# Patient Record
Sex: Female | Born: 1960
Health system: Southern US, Community
[De-identification: ages and names within clinical notes are randomized; demographics above are authoritative.]

## PROBLEM LIST (undated history)

## (undated) DIAGNOSIS — Z9889 Other specified postprocedural states: Secondary | ICD-10-CM

## (undated) DIAGNOSIS — L409 Psoriasis, unspecified: Secondary | ICD-10-CM

## (undated) DIAGNOSIS — Z9221 Personal history of antineoplastic chemotherapy: Secondary | ICD-10-CM

## (undated) DIAGNOSIS — C50919 Malignant neoplasm of unspecified site of unspecified female breast: Secondary | ICD-10-CM

## (undated) DIAGNOSIS — C801 Malignant (primary) neoplasm, unspecified: Secondary | ICD-10-CM

## (undated) DIAGNOSIS — R112 Nausea with vomiting, unspecified: Secondary | ICD-10-CM

## (undated) DIAGNOSIS — E079 Disorder of thyroid, unspecified: Secondary | ICD-10-CM

## (undated) HISTORY — PX: BREAST RECONSTRUCTION: SHX9

## (undated) HISTORY — PX: CRANIOTOMY FOR AVM: SUR332

## (undated) HISTORY — DX: Malignant (primary) neoplasm, unspecified: C80.1

## (undated) HISTORY — PX: ABDOMINAL HYSTERECTOMY: SHX81

## (undated) HISTORY — PX: TOTAL LAPAROSCOPIC HYSTERECTOMY WITH BILATERAL SALPINGO OOPHORECTOMY: SHX6845

## (undated) HISTORY — PX: KNEE ARTHROSCOPY: SUR90

## (undated) HISTORY — DX: Disorder of thyroid, unspecified: E07.9

## (undated) HISTORY — PX: BREAST SURGERY: SHX581

## (undated) HISTORY — PX: APPENDECTOMY: SHX54

## (undated) HISTORY — DX: Psoriasis, unspecified: L40.9

---
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
## 1996-02-20 HISTORY — PX: CRANIOTOMY FOR AVM: SUR332

## 2003-02-20 HISTORY — PX: KNEE ARTHROSCOPY: SUR90

## 2003-12-31 ENCOUNTER — Ambulatory Visit: Payer: Self-pay | Admitting: Unknown Physician Specialty

## 2004-06-15 ENCOUNTER — Ambulatory Visit: Payer: Self-pay | Admitting: Unknown Physician Specialty

## 2005-02-01 ENCOUNTER — Ambulatory Visit: Payer: Self-pay | Admitting: Unknown Physician Specialty

## 2006-02-07 ENCOUNTER — Ambulatory Visit: Payer: Self-pay | Admitting: Unknown Physician Specialty

## 2006-02-19 HISTORY — PX: DILATION AND CURETTAGE OF UTERUS: SHX78

## 2006-08-01 ENCOUNTER — Ambulatory Visit: Payer: Self-pay | Admitting: Unknown Physician Specialty

## 2007-03-06 ENCOUNTER — Ambulatory Visit: Payer: Self-pay | Admitting: Unknown Physician Specialty

## 2008-02-20 DIAGNOSIS — Z9221 Personal history of antineoplastic chemotherapy: Secondary | ICD-10-CM

## 2008-02-20 HISTORY — DX: Personal history of antineoplastic chemotherapy: Z92.21

## 2008-02-20 HISTORY — PX: BREAST BIOPSY: SHX20

## 2008-02-20 HISTORY — PX: MASTECTOMY: SHX3

## 2008-03-08 ENCOUNTER — Ambulatory Visit: Payer: Self-pay | Admitting: Unknown Physician Specialty

## 2008-03-22 ENCOUNTER — Ambulatory Visit: Payer: Self-pay | Admitting: Internal Medicine

## 2009-01-19 DIAGNOSIS — C50919 Malignant neoplasm of unspecified site of unspecified female breast: Secondary | ICD-10-CM

## 2009-01-19 HISTORY — DX: Malignant neoplasm of unspecified site of unspecified female breast: C50.919

## 2009-01-25 HISTORY — PX: BREAST SURGERY: SHX581

## 2009-01-28 ENCOUNTER — Ambulatory Visit: Payer: Self-pay | Admitting: General Surgery

## 2009-02-19 DIAGNOSIS — C801 Malignant (primary) neoplasm, unspecified: Secondary | ICD-10-CM

## 2009-02-19 HISTORY — PX: BREAST SURGERY: SHX581

## 2009-02-19 HISTORY — PX: ABDOMINAL HYSTERECTOMY: SHX81

## 2009-02-19 HISTORY — PX: PORTACATH PLACEMENT: SHX2246

## 2009-02-19 HISTORY — DX: Malignant (primary) neoplasm, unspecified: C80.1

## 2009-02-19 HISTORY — PX: BREAST RECONSTRUCTION: SHX9

## 2009-03-01 ENCOUNTER — Ambulatory Visit: Payer: Self-pay | Admitting: General Surgery

## 2009-03-10 ENCOUNTER — Ambulatory Visit: Payer: Self-pay | Admitting: General Surgery

## 2009-03-15 ENCOUNTER — Ambulatory Visit: Payer: Self-pay

## 2009-03-22 ENCOUNTER — Ambulatory Visit: Payer: Self-pay | Admitting: Oncology

## 2009-04-13 ENCOUNTER — Ambulatory Visit: Payer: Self-pay | Admitting: Oncology

## 2009-04-19 ENCOUNTER — Ambulatory Visit: Payer: Self-pay | Admitting: Oncology

## 2009-04-27 ENCOUNTER — Ambulatory Visit: Payer: Self-pay | Admitting: Oncology

## 2009-05-20 ENCOUNTER — Ambulatory Visit: Payer: Self-pay | Admitting: Oncology

## 2009-05-26 ENCOUNTER — Ambulatory Visit: Payer: Self-pay | Admitting: General Surgery

## 2009-06-19 ENCOUNTER — Ambulatory Visit: Payer: Self-pay | Admitting: Oncology

## 2009-07-20 ENCOUNTER — Ambulatory Visit: Payer: Self-pay | Admitting: Oncology

## 2009-08-19 ENCOUNTER — Ambulatory Visit: Payer: Self-pay | Admitting: Oncology

## 2009-08-19 HISTORY — PX: AUGMENTATION MAMMAPLASTY: SUR837

## 2009-09-09 ENCOUNTER — Ambulatory Visit: Payer: Self-pay | Admitting: General Surgery

## 2009-09-15 ENCOUNTER — Ambulatory Visit: Payer: Self-pay

## 2009-10-05 ENCOUNTER — Ambulatory Visit: Payer: Self-pay | Admitting: Oncology

## 2009-10-06 LAB — CANCER ANTIGEN 27.29: CA 27.29: 21.2 U/mL (ref 0.0–38.6)

## 2009-10-20 ENCOUNTER — Ambulatory Visit: Payer: Self-pay | Admitting: Oncology

## 2009-11-19 ENCOUNTER — Ambulatory Visit: Payer: Self-pay | Admitting: Oncology

## 2009-12-08 ENCOUNTER — Encounter: Admission: RE | Admit: 2009-12-08 | Discharge: 2009-12-08 | Payer: Self-pay

## 2009-12-29 ENCOUNTER — Ambulatory Visit: Payer: Self-pay | Admitting: Oncology

## 2010-01-03 ENCOUNTER — Ambulatory Visit: Payer: Self-pay

## 2010-01-19 ENCOUNTER — Ambulatory Visit: Payer: Self-pay | Admitting: Oncology

## 2010-01-31 ENCOUNTER — Ambulatory Visit: Payer: Self-pay

## 2010-02-02 ENCOUNTER — Ambulatory Visit: Payer: Self-pay

## 2010-03-21 ENCOUNTER — Ambulatory Visit: Payer: Self-pay | Admitting: Oncology

## 2010-03-22 ENCOUNTER — Ambulatory Visit: Payer: Self-pay | Admitting: Oncology

## 2010-03-22 LAB — CANCER ANTIGEN 27.29: CA 27.29: 25.8 U/mL (ref 0.0–38.6)

## 2010-04-25 ENCOUNTER — Ambulatory Visit: Payer: Self-pay | Admitting: Oncology

## 2010-05-09 ENCOUNTER — Ambulatory Visit: Payer: Self-pay

## 2010-05-21 ENCOUNTER — Ambulatory Visit: Payer: Self-pay | Admitting: Oncology

## 2010-08-21 ENCOUNTER — Ambulatory Visit: Payer: Self-pay | Admitting: Oncology

## 2010-09-20 ENCOUNTER — Ambulatory Visit: Payer: Self-pay | Admitting: Oncology

## 2011-02-22 ENCOUNTER — Ambulatory Visit: Payer: Self-pay | Admitting: Oncology

## 2011-02-22 LAB — COMPREHENSIVE METABOLIC PANEL
Albumin: 4 g/dL (ref 3.4–5.0)
Anion Gap: 8 (ref 7–16)
BUN: 21 mg/dL — ABNORMAL HIGH (ref 7–18)
Bilirubin,Total: 0.3 mg/dL (ref 0.2–1.0)
Chloride: 104 mmol/L (ref 98–107)
Creatinine: 1.25 mg/dL (ref 0.60–1.30)
Glucose: 93 mg/dL (ref 65–99)
Osmolality: 288 (ref 275–301)
Potassium: 4.4 mmol/L (ref 3.5–5.1)
Sodium: 143 mmol/L (ref 136–145)
Total Protein: 7.9 g/dL (ref 6.4–8.2)

## 2011-02-22 LAB — CBC CANCER CENTER
Basophil %: 0.4 %
Eosinophil %: 1.5 %
HGB: 15.7 g/dL (ref 12.0–16.0)
Lymphocyte %: 21.6 %
Neutrophil %: 70.6 %
RBC: 4.97 10*6/uL (ref 3.80–5.20)
WBC: 6 x10 3/mm (ref 3.6–11.0)

## 2011-02-23 LAB — CANCER ANTIGEN 27.29: CA 27.29: 34.2 U/mL (ref 0.0–38.6)

## 2011-03-23 ENCOUNTER — Ambulatory Visit: Payer: Self-pay | Admitting: Oncology

## 2011-08-27 ENCOUNTER — Ambulatory Visit: Payer: Self-pay | Admitting: Oncology

## 2011-08-27 LAB — CBC CANCER CENTER
Basophil #: 0 x10 3/mm (ref 0.0–0.1)
Eosinophil #: 0.1 x10 3/mm (ref 0.0–0.7)
Lymphocyte #: 1.3 x10 3/mm (ref 1.0–3.6)
MCH: 31.7 pg (ref 26.0–34.0)
MCHC: 33.3 g/dL (ref 32.0–36.0)
MCV: 95 fL (ref 80–100)
Monocyte #: 0.6 x10 3/mm (ref 0.2–0.9)
Monocyte %: 7.7 %
Platelet: 129 x10 3/mm — ABNORMAL LOW (ref 150–440)
RDW: 13.2 % (ref 11.5–14.5)
WBC: 8.3 x10 3/mm (ref 3.6–11.0)

## 2011-08-27 LAB — COMPREHENSIVE METABOLIC PANEL
Bilirubin,Total: 0.4 mg/dL (ref 0.2–1.0)
Calcium, Total: 9.5 mg/dL (ref 8.5–10.1)
Chloride: 104 mmol/L (ref 98–107)
Co2: 26 mmol/L (ref 21–32)
Creatinine: 1.11 mg/dL (ref 0.60–1.30)
EGFR (African American): >60
EGFR (Non-African Amer.): 58 — ABNORMAL LOW
Osmolality: 280 (ref 275–301)
SGPT (ALT): 25 U/L
Sodium: 140 mmol/L (ref 136–145)

## 2011-08-28 LAB — CANCER ANTIGEN 27.29: CA 27.29: 27.8 U/mL (ref 0.0–38.6)

## 2011-09-20 ENCOUNTER — Ambulatory Visit: Payer: Self-pay | Admitting: Oncology

## 2012-03-22 ENCOUNTER — Ambulatory Visit: Payer: Self-pay | Admitting: Oncology

## 2012-03-24 LAB — COMPREHENSIVE METABOLIC PANEL
Anion Gap: 7 (ref 7–16)
BUN: 19 mg/dL — ABNORMAL HIGH (ref 7–18)
Co2: 32 mmol/L (ref 21–32)
Creatinine: 0.99 mg/dL (ref 0.60–1.30)
SGPT (ALT): 25 U/L (ref 12–78)
Sodium: 142 mmol/L (ref 136–145)
Total Protein: 7.3 g/dL (ref 6.4–8.2)

## 2012-03-24 LAB — CBC CANCER CENTER
Eosinophil #: 0.1 x10 3/mm (ref 0.0–0.7)
HGB: 14.6 g/dL (ref 12.0–16.0)
Lymphocyte %: 28.8 %
MCHC: 34 g/dL (ref 32.0–36.0)
Monocyte #: 0.4 x10 3/mm (ref 0.2–0.9)
RBC: 4.66 10*6/uL (ref 3.80–5.20)
WBC: 5.1 x10 3/mm (ref 3.6–11.0)

## 2012-03-25 LAB — CANCER ANTIGEN 27.29: CA 27.29: 21.3 U/mL (ref 0.0–38.6)

## 2012-04-19 ENCOUNTER — Ambulatory Visit: Payer: Self-pay | Admitting: Oncology

## 2012-09-24 ENCOUNTER — Ambulatory Visit: Payer: Self-pay | Admitting: Oncology

## 2012-09-25 LAB — COMPREHENSIVE METABOLIC PANEL
BUN: 27 mg/dL — ABNORMAL HIGH (ref 7–18)
Creatinine: 1.21 mg/dL (ref 0.60–1.30)
EGFR (Non-African Amer.): 52 — ABNORMAL LOW
Glucose: 130 mg/dL — ABNORMAL HIGH (ref 65–99)
Potassium: 3.7 mmol/L (ref 3.5–5.1)
SGOT(AST): 25 U/L (ref 15–37)
SGPT (ALT): 25 U/L (ref 12–78)
Sodium: 141 mmol/L (ref 136–145)

## 2012-09-25 LAB — CBC CANCER CENTER
Basophil #: 0 x10 3/mm (ref 0.0–0.1)
Eosinophil #: 0 x10 3/mm (ref 0.0–0.7)
HCT: 42.7 % (ref 35.0–47.0)
HGB: 14.7 g/dL (ref 12.0–16.0)
Lymphocyte #: 1.2 x10 3/mm (ref 1.0–3.6)
MCHC: 34.5 g/dL (ref 32.0–36.0)
MCV: 92 fL (ref 80–100)
Monocyte #: 0.4 x10 3/mm (ref 0.2–0.9)
Monocyte %: 4.8 %
Neutrophil %: 78.4 %
RDW: 13.4 % (ref 11.5–14.5)

## 2012-09-28 LAB — CANCER ANTIGEN 27.29: CA 27.29: 17.6 U/mL (ref 0.0–38.6)

## 2012-10-07 ENCOUNTER — Encounter: Payer: Self-pay | Admitting: *Deleted

## 2012-10-20 ENCOUNTER — Ambulatory Visit: Payer: Self-pay | Admitting: Oncology

## 2012-10-22 ENCOUNTER — Ambulatory Visit: Payer: Self-pay | Admitting: General Surgery

## 2012-10-29 ENCOUNTER — Ambulatory Visit (INDEPENDENT_AMBULATORY_CARE_PROVIDER_SITE_OTHER): Payer: 59 | Admitting: General Surgery

## 2012-10-29 ENCOUNTER — Encounter: Payer: Self-pay | Admitting: General Surgery

## 2012-10-29 VITALS — BP 118/80 | HR 64 | Resp 12 | Ht 64.0 in | Wt 133.0 lb

## 2012-10-29 DIAGNOSIS — Z1211 Encounter for screening for malignant neoplasm of colon: Secondary | ICD-10-CM

## 2012-10-29 MED ORDER — POLYETHYLENE GLYCOL 3350 17 GM/SCOOP PO POWD: ORAL | Status: DC

## 2012-10-29 NOTE — Progress Notes (Signed)
Patient ID: Susan Green, female   DOB: [birth year], 52 y.o.   MRN: [id number]  Chief Complaint  Patient presents with  . Other    colonoscopy    HPI Susan Green is a 52 y.o. female.  Patient here today to discuss having a colonoscopy and bone density test referred by Dr Haskel Khan. Denies GI symptoms.  States BM are regular and on occasion loose stool but no bleeding. HPI  Past Medical History  Diagnosis Date  . Psoriasis   . Thyroid disease     hypothyroidism  . 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 Tonsillectomy and adenoidectomy  1966  . Knee arthroscopy Right 2005  . Dilation and curettage of uterus  2008    polyp  . Abdominal hysterectomy  2011  . Craniotomy for avm  1998  . Portacath placement  2011  . Breast surgery Right 01/25/2009    mastectomy  . Breast reconstruction  2011, 2012    3  . Breast biopsy Right October 02, 2012    Fat necrosis    Family History  Problem Relation Age of Onset  . Colon cancer Paternal Grandfather 61    Social History History  Substance Use Topics  . Smoking status: Never Smoker   . Smokeless tobacco: Not on file  . Alcohol Use: No    Allergies  Allergen Reactions  . Codeine Nausea And Vomiting  . Demerol [Meperidine] Nausea Only    Current Outpatient Prescriptions  Medication Sig Dispense Refill  . levothyroxine (SYNTHROID, LEVOTHROID) 100 MCG tablet Take 1 tablet by mouth daily.      . Multiple Vitamin (MULTIVITAMIN) capsule Take 1 capsule by mouth daily.      . tamoxifen (NOLVADEX) 20 MG tablet Take 1 tablet by mouth daily.      . valACYclovir (VALTREX) 500 MG tablet Take 1 tablet by mouth as needed.       . polyethylene glycol powder (GLYCOLAX/MIRALAX) powder 255 grams one bottle for colonoscopy prep  255 g  0   No current facility-administered medications for this visit.     Review of Systems Review of Systems  Constitutional: Negative.   Respiratory: Negative.   Cardiovascular: Negative.   Gastrointestinal: Negative for vomiting, constipation, blood in stool and rectal pain.    Blood pressure 118/80, pulse 64, resp. rate 12, height 5\' 4"  (1.626 m), weight 133 lb (60.328 kg).  Physical Exam Physical Exam  Constitutional: She appears well-developed and well-nourished.  Neck: Normal range of motion. Neck supple.  Cardiovascular: Normal rate and regular rhythm.   Pulmonary/Chest: Effort normal and breath sounds normal.    Data Reviewed None  Assessment    Candidate for screening colonoscopy.    Plan    The pros and cons of the procedure were reviewed. This will be scheduled at a convenient day.     This patient will contact Dr. Wyn Forster office to arrange bone density screening.  Patient has been scheduled for a colonoscopy on 01-06-13 at [facility].    Earline Mayotte 10/31/2012, 5:16 PM

## 2012-10-29 NOTE — Patient Instructions (Addendum)
Colonoscopy A colonoscopy is an exam to evaluate your entire colon. In this exam, your colon is cleansed. A long fiberoptic tube is inserted through your rectum and into your colon. The fiberoptic scope (endoscope) is a long bundle of enclosed and very flexible fibers. These fibers transmit light to the area examined and send images from that area to your caregiver. Discomfort is usually minimal. You may be given a drug to help you sleep (sedative) during or prior to the procedure. This exam helps to detect lumps (tumors), polyps, inflammation, and areas of bleeding. Your caregiver may also take a small piece of tissue (biopsy) that will be examined under a microscope. LET YOUR CAREGIVER KNOW ABOUT:   Allergies to food or medicine.  Medicines taken, including vitamins, herbs, eyedrops, over-the-counter medicines, and creams.  Use of steroids (by mouth or creams).  Previous problems with anesthetics or numbing medicines.  History of bleeding problems or blood clots.  Previous surgery.  Other health problems, including diabetes and kidney problems.  Possibility of pregnancy, if this applies. BEFORE THE PROCEDURE   A clear liquid diet may be required for 2 days before the exam.  Ask your caregiver about changing or stopping your regular medications.  Liquid injections (enemas) or laxatives may be required.  A large amount of electrolyte solution may be given to you to drink over a short period of time. This solution is used to clean out your colon.  You should be present 60 minutes prior to your procedure or as directed by your caregiver. AFTER THE PROCEDURE   If you received a sedative or pain relieving medication, you will need to arrange for someone to drive you home.  Occasionally, there is a little blood passed with the first bowel movement. Do not be concerned. FINDING OUT THE RESULTS OF YOUR TEST Not all test results are available during your visit. If your test results are  not back during the visit, make an appointment with your caregiver to find out the results. Do not assume everything is normal if you have not heard from your caregiver or the medical facility. It is important for you to follow up on all of your test results. HOME CARE INSTRUCTIONS   It is not unusual to pass moderate amounts of gas and experience mild abdominal cramping following the procedure. This is due to air being used to inflate your colon during the exam. Walking or a warm pack on your belly (abdomen) may help.  You may resume all normal meals and activities after sedatives and medicines have worn off.  Only take over-the-counter or prescription medicines for pain, discomfort, or fever as directed by your caregiver. Do not use aspirin or blood thinners if a biopsy was taken. Consult your caregiver for medicine usage if biopsies were taken. SEEK IMMEDIATE MEDICAL CARE IF:   You have a fever.  You pass large blood clots or fill a toilet with blood following the procedure. This may also occur 10 to 14 days following the procedure. This is more likely if a biopsy was taken.  You develop abdominal pain that keeps getting worse and cannot be relieved with medicine. Document Released: 02/03/2000 Document Revised: 04/30/2011 Document Reviewed: 09/18/2007 [facility] Patient Information 2014 [place], [place].  This patient will contact Dr. Wyn Forster office to arrange bone density screening.  Patient has been scheduled for a colonoscopy on 01-06-13 at [facility].

## 2012-10-31 ENCOUNTER — Encounter: Payer: Self-pay | Admitting: General Surgery

## 2012-10-31 DIAGNOSIS — Z1211 Encounter for screening for malignant neoplasm of colon: Secondary | ICD-10-CM | POA: Insufficient documentation

## 2012-11-04 ENCOUNTER — Ambulatory Visit: Payer: Self-pay | Admitting: General Surgery

## 2012-11-11 ENCOUNTER — Ambulatory Visit: Payer: Self-pay | Admitting: Otolaryngology

## 2012-12-27 ENCOUNTER — Other Ambulatory Visit: Payer: Self-pay | Admitting: General Surgery

## 2012-12-27 DIAGNOSIS — Z1211 Encounter for screening for malignant neoplasm of colon: Secondary | ICD-10-CM

## 2012-12-29 ENCOUNTER — Telehealth: Payer: Self-pay | Admitting: *Deleted

## 2012-12-29 NOTE — Telephone Encounter (Signed)
Patient was contacted to verify that her medications are still the same. This patient reports she is still taking what she was on at last office visit except for valtrex and has added Fosamax (generic) and calcium with vitamin D 3. Medication list has been updated accordingly. Patient was instructed to pre-register by Friday since she has not done so already. We will proceed with colonoscopy that is scheduled for 01-06-13 at [facility].

## 2013-01-06 ENCOUNTER — Ambulatory Visit: Payer: Self-pay | Admitting: General Surgery

## 2013-01-06 DIAGNOSIS — Z1211 Encounter for screening for malignant neoplasm of colon: Secondary | ICD-10-CM

## 2013-01-07 ENCOUNTER — Encounter: Payer: Self-pay | Admitting: General Surgery

## 2013-01-26 ENCOUNTER — Encounter: Payer: Self-pay | Admitting: General Surgery

## 2013-01-27 ENCOUNTER — Encounter: Payer: Self-pay | Admitting: General Surgery

## 2013-03-27 ENCOUNTER — Ambulatory Visit: Payer: Self-pay | Admitting: Oncology

## 2013-04-25 ENCOUNTER — Observation Stay: Payer: Self-pay | Admitting: General Surgery

## 2013-04-25 DIAGNOSIS — K562 Volvulus: Secondary | ICD-10-CM

## 2013-04-25 LAB — URINALYSIS, COMPLETE
BLOOD: NEGATIVE
Bacteria: NONE SEEN
Bilirubin,UR: NEGATIVE
Glucose,UR: NEGATIVE mg/dL (ref 0–75)
Hyaline Cast: 2
Leukocyte Esterase: NEGATIVE
NITRITE: NEGATIVE
PH: 5 (ref 4.5–8.0)
Protein: NEGATIVE
RBC,UR: NONE SEEN /HPF (ref 0–5)
SPECIFIC GRAVITY: 1.026 (ref 1.003–1.030)
WBC UR: 3 /HPF (ref 0–5)

## 2013-04-25 LAB — CBC WITH DIFFERENTIAL/PLATELET
BASOS PCT: 0.8 %
Basophil #: 0.1 10*3/uL (ref 0.0–0.1)
EOS PCT: 0.2 %
Eosinophil #: 0 10*3/uL (ref 0.0–0.7)
HCT: 42.3 % (ref 35.0–47.0)
HGB: 13.5 g/dL (ref 12.0–16.0)
LYMPHS ABS: 1.3 10*3/uL (ref 1.0–3.6)
LYMPHS PCT: 12.2 %
MCH: 29.2 pg (ref 26.0–34.0)
MCHC: 32 g/dL (ref 32.0–36.0)
MCV: 92 fL (ref 80–100)
MONOS PCT: 5.3 %
Monocyte #: 0.6 x10 3/mm (ref 0.2–0.9)
NEUTROS ABS: 8.6 10*3/uL — AB (ref 1.4–6.5)
Neutrophil %: 81.5 %
PLATELETS: 197 10*3/uL (ref 150–440)
RBC: 4.62 10*6/uL (ref 3.80–5.20)
RDW: 12.8 % (ref 11.5–14.5)
WBC: 10.6 10*3/uL (ref 3.6–11.0)

## 2013-04-25 LAB — COMPREHENSIVE METABOLIC PANEL
Albumin: 3.9 g/dL (ref 3.4–5.0)
Alkaline Phosphatase: 47 U/L
Anion Gap: 17 — ABNORMAL HIGH (ref 7–16)
BUN: 25 mg/dL — ABNORMAL HIGH (ref 7–18)
Bilirubin,Total: 0.4 mg/dL (ref 0.2–1.0)
Calcium, Total: 8.7 mg/dL (ref 8.5–10.1)
Chloride: 101 mmol/L (ref 98–107)
Co2: 29 mmol/L (ref 21–32)
Creatinine: 1.04 mg/dL (ref 0.60–1.30)
EGFR (African American): >60
EGFR (Non-African Amer.): >60
Glucose: 127 mg/dL — ABNORMAL HIGH (ref 65–99)
Osmolality: 298 (ref 275–301)
Potassium: 3.7 mmol/L (ref 3.5–5.1)
SGOT(AST): 36 U/L (ref 15–37)
SGPT (ALT): 26 U/L (ref 12–78)
Sodium: 147 mmol/L — ABNORMAL HIGH (ref 136–145)
Total Protein: 7.3 g/dL (ref 6.4–8.2)

## 2013-04-26 LAB — CBC WITH DIFFERENTIAL/PLATELET
BASOS PCT: 1.2 %
Basophil #: 0.1 10*3/uL (ref 0.0–0.1)
Eosinophil #: 0.1 10*3/uL (ref 0.0–0.7)
Eosinophil %: 1.9 %
HCT: 39.5 % (ref 35.0–47.0)
HGB: 13.1 g/dL (ref 12.0–16.0)
LYMPHS ABS: 1.7 10*3/uL (ref 1.0–3.6)
Lymphocyte %: 30.8 %
MCH: 30.4 pg (ref 26.0–34.0)
MCHC: 33.2 g/dL (ref 32.0–36.0)
MCV: 92 fL (ref 80–100)
MONO ABS: 0.5 x10 3/mm (ref 0.2–0.9)
Monocyte %: 9.4 %
NEUTROS ABS: 3.1 10*3/uL (ref 1.4–6.5)
Neutrophil %: 56.7 %
PLATELETS: 173 10*3/uL (ref 150–440)
RBC: 4.31 10*6/uL (ref 3.80–5.20)
RDW: 13.2 % (ref 11.5–14.5)
WBC: 5.5 10*3/uL (ref 3.6–11.0)

## 2013-04-27 ENCOUNTER — Other Ambulatory Visit: Payer: Self-pay | Admitting: General Surgery

## 2013-04-27 ENCOUNTER — Encounter: Payer: Self-pay | Admitting: General Surgery

## 2013-04-27 DIAGNOSIS — K562 Volvulus: Secondary | ICD-10-CM

## 2013-04-27 DIAGNOSIS — R109 Unspecified abdominal pain: Secondary | ICD-10-CM

## 2013-04-28 ENCOUNTER — Telehealth: Payer: Self-pay | Admitting: *Deleted

## 2013-04-28 NOTE — Telephone Encounter (Signed)
fmla papers

## 2013-04-29 ENCOUNTER — Other Ambulatory Visit: Payer: Self-pay | Admitting: *Deleted

## 2013-04-29 ENCOUNTER — Ambulatory Visit: Payer: Self-pay | Admitting: General Surgery

## 2013-04-29 DIAGNOSIS — K562 Volvulus: Secondary | ICD-10-CM

## 2013-04-29 HISTORY — PX: APPENDECTOMY: SHX54

## 2013-04-29 HISTORY — PX: OTHER SURGICAL HISTORY: SHX169

## 2013-04-29 MED ORDER — ONDANSETRON 4 MG PO TBDP: 4.0000 mg | ORAL_TABLET | Freq: Four times a day (QID) | ORAL | Status: DC | PRN

## 2013-04-30 ENCOUNTER — Encounter: Payer: Self-pay | Admitting: General Surgery

## 2013-05-01 LAB — PATHOLOGY REPORT

## 2013-05-04 ENCOUNTER — Encounter: Payer: Self-pay | Admitting: General Surgery

## 2013-05-06 ENCOUNTER — Ambulatory Visit (INDEPENDENT_AMBULATORY_CARE_PROVIDER_SITE_OTHER): Payer: 59 | Admitting: General Surgery

## 2013-05-06 ENCOUNTER — Encounter: Payer: Self-pay | Admitting: General Surgery

## 2013-05-06 VITALS — BP 124/80 | HR 80 | Resp 12 | Ht 64.0 in | Wt 134.0 lb

## 2013-05-06 DIAGNOSIS — K562 Volvulus: Secondary | ICD-10-CM

## 2013-05-06 DIAGNOSIS — K37 Unspecified appendicitis: Secondary | ICD-10-CM

## 2013-05-06 NOTE — Progress Notes (Signed)
This is a 53 year old female here today for her post op Appendectomy and cecopexy which was done on 04/29/13. Patient states she is still have pain and burning at the incision site. Abdomen soft and non-tender. Incision site is clean and healing well.  Overall she is doing well. Primary problem appears to be constipation.  Advised to continue Miralax and use dulcolax tablets periodically.  Recheck in two weeks. Her presenting problem was cecal hypermobility. Appendix was removed in conjunction with cecopexy but path showed there was early appendicitis.

## 2013-05-06 NOTE — Patient Instructions (Signed)
Return in two weeks.  

## 2013-05-07 ENCOUNTER — Telehealth: Payer: Self-pay | Admitting: *Deleted

## 2013-05-07 NOTE — Telephone Encounter (Signed)
Patient states she had 4 bowel movement today.  Soft stools not runny.Patient will follow up as scheduled . She will call office for any  issues or concerns.

## 2013-05-20 ENCOUNTER — Encounter: Payer: Self-pay | Admitting: General Surgery

## 2013-05-20 ENCOUNTER — Ambulatory Visit (INDEPENDENT_AMBULATORY_CARE_PROVIDER_SITE_OTHER): Payer: 59 | Admitting: General Surgery

## 2013-05-20 VITALS — BP 120/72 | HR 74 | Resp 12 | Ht 64.0 in | Wt 134.0 lb

## 2013-05-20 DIAGNOSIS — K562 Volvulus: Secondary | ICD-10-CM

## 2013-05-20 NOTE — Progress Notes (Signed)
This is a 53 year old female here today for her post op appendectomy and cecopexy which was done on 04/29/13. Patient states she is improving from pain and burning at the incision site.   Abdomen soft and non-tender. Port sites are well healed.   Overall she is doing very well. Primary problem of constipation has improved.  She may return to work 05-24-13 as scheduled.

## 2013-05-20 NOTE — Patient Instructions (Signed)
The patient is aware to call back for any questions or concerns.  

## 2013-05-21 ENCOUNTER — Ambulatory Visit: Payer: Self-pay | Admitting: Oncology

## 2013-05-22 LAB — CBC CANCER CENTER
BASOS ABS: 0 x10 3/mm (ref 0.0–0.1)
BASOS PCT: 0.9 %
Eosinophil #: 0.1 x10 3/mm (ref 0.0–0.7)
Eosinophil %: 1.9 %
HCT: 43.7 % (ref 35.0–47.0)
HGB: 14.4 g/dL (ref 12.0–16.0)
LYMPHS PCT: 25.6 %
Lymphocyte #: 1.4 x10 3/mm (ref 1.0–3.6)
MCH: 30.5 pg (ref 26.0–34.0)
MCHC: 33 g/dL (ref 32.0–36.0)
MCV: 92 fL (ref 80–100)
Monocyte #: 0.4 x10 3/mm (ref 0.2–0.9)
Monocyte %: 7.3 %
Neutrophil #: 3.6 x10 3/mm (ref 1.4–6.5)
Neutrophil %: 64.3 %
Platelet: 177 x10 3/mm (ref 150–440)
RBC: 4.74 10*6/uL (ref 3.80–5.20)
RDW: 13.7 % (ref 11.5–14.5)
WBC: 5.6 x10 3/mm (ref 3.6–11.0)

## 2013-05-22 LAB — COMPREHENSIVE METABOLIC PANEL
ALBUMIN: 4.2 g/dL (ref 3.4–5.0)
ANION GAP: 3 — AB (ref 7–16)
AST: 46 U/L — AB (ref 15–37)
Alkaline Phosphatase: 49 U/L
BUN: 22 mg/dL — AB (ref 7–18)
Bilirubin,Total: 0.4 mg/dL (ref 0.2–1.0)
CALCIUM: 8.7 mg/dL (ref 8.5–10.1)
Chloride: 106 mmol/L (ref 98–107)
Co2: 32 mmol/L (ref 21–32)
Creatinine: 1.06 mg/dL (ref 0.60–1.30)
Glucose: 84 mg/dL (ref 65–99)
Osmolality: 284 (ref 275–301)
POTASSIUM: 3.7 mmol/L (ref 3.5–5.1)
SGPT (ALT): 31 U/L (ref 12–78)
Sodium: 141 mmol/L (ref 136–145)
Total Protein: 7.8 g/dL (ref 6.4–8.2)

## 2013-05-23 LAB — CANCER ANTIGEN 27.29: CA 27.29: 17.7 U/mL (ref 0.0–38.6)

## 2013-06-19 ENCOUNTER — Ambulatory Visit: Payer: Self-pay | Admitting: Oncology

## 2013-07-20 ENCOUNTER — Ambulatory Visit: Payer: Self-pay | Admitting: Oncology

## 2013-12-21 ENCOUNTER — Encounter: Payer: Self-pay | Admitting: General Surgery

## 2014-01-27 ENCOUNTER — Ambulatory Visit: Payer: Self-pay | Admitting: Oncology

## 2014-01-27 LAB — CBC CANCER CENTER
BASOS PCT: 0.7 %
Basophil #: 0 x10 3/mm (ref 0.0–0.1)
EOS ABS: 0.1 x10 3/mm (ref 0.0–0.7)
EOS PCT: 2.1 %
HCT: 42.2 % (ref 35.0–47.0)
HGB: 13.8 g/dL (ref 12.0–16.0)
LYMPHS ABS: 1.5 x10 3/mm (ref 1.0–3.6)
Lymphocyte %: 22.1 %
MCH: 30.5 pg (ref 26.0–34.0)
MCHC: 32.6 g/dL (ref 32.0–36.0)
MCV: 94 fL (ref 80–100)
MONO ABS: 0.6 x10 3/mm (ref 0.2–0.9)
MONOS PCT: 8.6 %
NEUTROS PCT: 66.5 %
Neutrophil #: 4.4 x10 3/mm (ref 1.4–6.5)
PLATELETS: 145 x10 3/mm — AB (ref 150–440)
RBC: 4.51 10*6/uL (ref 3.80–5.20)
RDW: 13.2 % (ref 11.5–14.5)
WBC: 6.6 x10 3/mm (ref 3.6–11.0)

## 2014-01-27 LAB — COMPREHENSIVE METABOLIC PANEL
ALK PHOS: 49 U/L
ANION GAP: 3 — AB (ref 7–16)
Albumin: 3.4 g/dL (ref 3.4–5.0)
BILIRUBIN TOTAL: 0.3 mg/dL (ref 0.2–1.0)
BUN: 18 mg/dL (ref 7–18)
CHLORIDE: 103 mmol/L (ref 98–107)
CO2: 34 mmol/L — AB (ref 21–32)
CREATININE: 1.02 mg/dL (ref 0.60–1.30)
Calcium, Total: 8.4 mg/dL — ABNORMAL LOW (ref 8.5–10.1)
EGFR (Non-African Amer.): >60
Glucose: 140 mg/dL — ABNORMAL HIGH (ref 65–99)
OSMOLALITY: 284 (ref 275–301)
POTASSIUM: 3.7 mmol/L (ref 3.5–5.1)
SGOT(AST): 22 U/L (ref 15–37)
SGPT (ALT): 22 U/L
Sodium: 140 mmol/L (ref 136–145)
Total Protein: 6.7 g/dL (ref 6.4–8.2)

## 2014-01-28 LAB — CANCER ANTIGEN 27.29: CA 27.29: 20 U/mL (ref 0.0–38.6)

## 2014-02-19 ENCOUNTER — Ambulatory Visit: Payer: Self-pay | Admitting: Oncology

## 2014-04-28 ENCOUNTER — Encounter
Admit: 2014-04-28 | Disposition: A | Discharge status: Home / Self Care | Payer: Self-pay | Attending: General Practice | Admitting: General Practice

## 2014-05-21 ENCOUNTER — Encounter
Admit: 2014-05-21 | Disposition: A | Discharge status: Home / Self Care | Payer: Self-pay | Attending: General Practice | Admitting: General Practice

## 2014-06-01 ENCOUNTER — Encounter: Payer: Self-pay | Admitting: *Deleted

## 2014-06-04 ENCOUNTER — Other Ambulatory Visit: Payer: Self-pay | Admitting: Oncology

## 2014-06-04 DIAGNOSIS — Z853 Personal history of malignant neoplasm of breast: Secondary | ICD-10-CM

## 2014-06-12 NOTE — Op Note (Signed)
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   OPERATION PERFORMED: Laparoscopy, cecopexy and appendectomy.   SURGEON: S.G. Jamal Collin, M.D.   ANESTHESIA: General.   COMPLICATIONS: None.   ESTIMATED BLOOD LOSS: Minimal.   DRAINS: None.   DESCRIPTION OF PROCEDURE: The patient was put to sleep in the supine position on the operating table. Foley catheter was inserted, which was removed at the end of the procedure. The abdomen was prepped and draped out as a sterile field. Timeout was performed. A small incision was made at the upper lip of the umbilicus. The Veress needle with the InnerDyne sleeve was positioned in the peritoneal cavity and verified with the hanging drop method and pneumoperitoneum was obtained. A 10 mm port was then placed. Following this, a 5 mm left lower quadrant port and a 12 mm port in the left lower abdomen laterally was placed. Evaluation of the abdominal cavity was then performed and there was noted to be no pathology identifiable over the pelvic structures. She had a remnant of her cervix. Ovaries were not identified. No hernias were noted. The cecum was lying low in the pelvis and was extremely redundant up to the mid lateral portion and seemed to be easily manipulated into the left upper quadrant area. The appendix was noted to be normal. An appendectomy was performed. The mesoappendix was freed from the base of the appendix, and using the Endo GIA blue load, the appendix was taken down followed by the white load on the mesoappendix and these were brought out with a retrieval bag through the larger port site on the left side.   Following this, the cecum was placed against the lateral wall going into the pelvis and using the Endo-360 device, 2-0 Polysorb sutures were placed,  a total of 4 approximating the cecum to the lateral pelvic wall and the lateral abdominal wall. Following this, the fascial opening in the 12 mm port site was closed with a figure-of-eight 0 Vicryl stitch placed with a suture passer. Pneumoperitoneum was released and the remaining ports were removed. The fascial opening in the umbilicus was closed also with 0 Vicryl and all skin incisions were closed with subcuticular 4-0 Vicryl covered with Dermabond. The procedure was well tolerated. She was subsequently extubated and returned to the recovery room in stable condition.   ____________________________ S.Robinette Haines, MD sgs:aw D: 04/30/2013 08:03:04 ET T: 04/30/2013 08:13:40 ET JOB#: [id number]  cc: Synthia Innocent. Jamal Collin, MD, <Dictator> [facility] Robinette Haines MD ELECTRONICALLY SIGNED 04/30/2013 15:11

## 2014-06-12 NOTE — H&P (Signed)
PATIENT NAME:  Susan Green, Susan Green MR#:  [id number] DATE OF BIRTH:  1960/10/26  DATE OF ADMISSION:  04/25/2013  HISTORY OF PRESENT ILLNESS: This is a 54 year old female who presented to the Emergency Room overnight after she started having significant lower abdominal pain while at work here overnight. The pain persisted and became fairly severe and subsequently had some Fleet's enemas, which seemed to help a little bit, but she continued to have pain and presented to the Emergency Room. The patient has not had any episode similar to this in the past and has been fairly active and denied any kind of abdominal symptoms in the past. The patient was monitored and evaluated in the Emergency Room, and a CT scan was performed showing a malpositioning of the cecum more to the medial and superior aspect, suggesting a developing volvulus. Surgical input was requested, but the patient now says the pain has fully resolved and she is feeling okay. She had some nausea after she had gotten some Dilaudid IV, which she has noticed has been a problem in the past with Dilaudid. She seems to tolerate morphine fairly well, but does not seem to have the same effectiveness with other narcotics. She did not have any nausea or vomiting prior to her getting the Dilaudid. At present, the patient is comfortable, has no abdominal pain and no nausea or vomiting at this time.   PAST MEDICAL HISTORY: This patient has a history of right breast cancer treated with a skin-sparing mastectomy with implant. 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 This was in 2010. The patient currently is on antihormonal treatment with tamoxifen. Followup exams and CA 27-29 have been normal. The patient also had a normal colonoscopy in November 2014. History of hypothyroidism and is on replacement with 0.1 mg of thyroid daily. Only other medications she takes are over-the-counter supplements. She does have  a history of some mild osteoporosis and is on calcium supplement. In addition to all this, the patient has had right knee surgery. 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 She is also status post total hysterectomy and T and A as a child.   REVIEW OF SYSTEMS: The patient is denying anything other than the episode of abdominal pain and the subsequent nausea after pain medicine. No fever, no chills. No shortness of breath. No chest pain. No leg symptoms.   PHYSICAL EXAMINATION:  GENERAL: The patient is a pleasant female who is not in any acute distress at this time. EYES: Conjunctivae are normal. Sclerae nonicteric.  NECK: Supple. No nodes or masses palpable.  LUNGS: Clear to auscultation and percussion.  HEART: In sinus rhythm without any murmurs.  ABDOMEN: Fairly soft at this time. There is no apparent distention, and there is no tenderness identified at this point. Earlier evaluations had showed moderate amount of tenderness throughout the lower abdomen, but mostly in the right lower quadrant area. There is no guarding or rebound. Bowel sounds are active at this point. No hernias are detected.  EXTREMITIES: Free of any edema or cyanosis. The patient has good peripheral pulses. She has a patch of xerotic skin irritation in the right leg, which has been a chronic condition.   LABORATORY DATA: Shows normal white count and hemoglobin. Chemistries are essentially normal.   IMAGING: CT scan was reviewed and shows the malpositioning of the cecum, as mentioned before, being rotated  medially and superiorly, and this is certainly consistent with the potential for volvulus.   IMPRESSION: Given the fact that the patient's pain has fully subsided now, it appears that the cecum may have reverted back to its position, I do not feel there is any intervention required at this particular point. The patient has had a normal colonoscopy recently, which  precludes any kind of intraluminal pathology to account for this problem. Clinical impression is that of a redundant cecum that has a tendency to rotate. At this particular point, there is no urgent indication for intervention. I feel if she remains symptom-free, re-evaluation and possible elective surgery to correct the cecal rotation would be appropriate in the form of cecopexy. I have discussed all this in some detail with the patient. At this point, it is prudent to observe the patient for 24 hours to ensure that she does not have recurrence of the pain, which may necessitate earlier intervention. If she is stable enough, she can be discharged and followed as an outpatient. The patient is agreeable to this plan. She will be placed in observation for 23 hours and discharged if stable thereafter.   ____________________________ S.Robinette Haines, MD sgs:lb D: 04/25/2013 08:47:29 ET T: 04/25/2013 09:09:15 ET JOB#: [id number]  cc: S.G. Jamal Collin, MD, <Dictator> [facility] Robinette Haines MD ELECTRONICALLY SIGNED 04/25/2013 11:32

## 2014-06-17 ENCOUNTER — Ambulatory Visit
Admit: 2014-06-17 | Disposition: A | Discharge status: Home / Self Care | Payer: Self-pay | Attending: General Practice | Admitting: General Practice

## 2014-06-28 ENCOUNTER — Other Ambulatory Visit: Payer: Self-pay | Admitting: Orthopedic Surgery

## 2014-06-28 DIAGNOSIS — M25562 Pain in left knee: Secondary | ICD-10-CM

## 2014-07-05 ENCOUNTER — Ambulatory Visit
Admission: RE | Admit: 2014-07-05 | Discharge: 2014-07-05 | Disposition: A | Discharge status: Home / Self Care | Payer: 59 | Source: Ambulatory Visit | Attending: Orthopedic Surgery | Admitting: Orthopedic Surgery

## 2014-07-05 DIAGNOSIS — X58XXXA Exposure to other specified factors, initial encounter: Secondary | ICD-10-CM | POA: Insufficient documentation

## 2014-07-05 DIAGNOSIS — M25562 Pain in left knee: Secondary | ICD-10-CM

## 2014-07-05 DIAGNOSIS — S83242A Other tear of medial meniscus, current injury, left knee, initial encounter: Secondary | ICD-10-CM | POA: Diagnosis not present

## 2014-07-05 DIAGNOSIS — M2392 Unspecified internal derangement of left knee: Secondary | ICD-10-CM | POA: Diagnosis present

## 2014-07-28 ENCOUNTER — Ambulatory Visit
Admission: RE | Admit: 2014-07-28 | Discharge: 2014-07-28 | Disposition: A | Discharge status: Home / Self Care | Payer: 59 | Source: Ambulatory Visit | Attending: Oncology | Admitting: Oncology

## 2014-07-28 ENCOUNTER — Other Ambulatory Visit: Payer: Self-pay | Admitting: Oncology

## 2014-07-28 DIAGNOSIS — Z1231 Encounter for screening mammogram for malignant neoplasm of breast: Secondary | ICD-10-CM | POA: Insufficient documentation

## 2014-07-28 DIAGNOSIS — Z9882 Breast implant status: Secondary | ICD-10-CM | POA: Diagnosis not present

## 2014-07-28 DIAGNOSIS — Z853 Personal history of malignant neoplasm of breast: Secondary | ICD-10-CM

## 2014-07-28 HISTORY — DX: Malignant neoplasm of unspecified site of unspecified female breast: C50.919

## 2014-08-05 ENCOUNTER — Other Ambulatory Visit: Payer: Self-pay | Admitting: *Deleted

## 2014-08-05 DIAGNOSIS — C50919 Malignant neoplasm of unspecified site of unspecified female breast: Secondary | ICD-10-CM

## 2014-08-09 ENCOUNTER — Other Ambulatory Visit: Payer: Self-pay

## 2014-08-09 ENCOUNTER — Ambulatory Visit: Payer: Self-pay | Admitting: Oncology

## 2014-08-12 ENCOUNTER — Other Ambulatory Visit: Payer: Self-pay

## 2014-08-12 ENCOUNTER — Ambulatory Visit: Payer: Self-pay | Admitting: Oncology

## 2014-08-19 ENCOUNTER — Inpatient Hospital Stay: Payer: 59 | Attending: Oncology | Admitting: Oncology

## 2014-08-19 ENCOUNTER — Inpatient Hospital Stay: Payer: 59

## 2014-08-19 VITALS — BP 111/68 | HR 61 | Temp 96.6°F | Wt 131.8 lb

## 2014-08-19 DIAGNOSIS — Z9013 Acquired absence of bilateral breasts and nipples: Secondary | ICD-10-CM

## 2014-08-19 DIAGNOSIS — Z79899 Other long term (current) drug therapy: Secondary | ICD-10-CM | POA: Diagnosis not present

## 2014-08-19 DIAGNOSIS — E039 Hypothyroidism, unspecified: Secondary | ICD-10-CM | POA: Diagnosis not present

## 2014-08-19 DIAGNOSIS — Z809 Family history of malignant neoplasm, unspecified: Secondary | ICD-10-CM | POA: Diagnosis not present

## 2014-08-19 DIAGNOSIS — Z9221 Personal history of antineoplastic chemotherapy: Secondary | ICD-10-CM

## 2014-08-19 DIAGNOSIS — Z170 Estrogen receptor positive status [ER+]: Secondary | ICD-10-CM

## 2014-08-19 DIAGNOSIS — Z79810 Long term (current) use of selective estrogen receptor modulators (SERMs): Secondary | ICD-10-CM | POA: Diagnosis not present

## 2014-08-19 DIAGNOSIS — Z90710 Acquired absence of both cervix and uterus: Secondary | ICD-10-CM

## 2014-08-19 DIAGNOSIS — C50911 Malignant neoplasm of unspecified site of right female breast: Secondary | ICD-10-CM | POA: Diagnosis not present

## 2014-08-19 DIAGNOSIS — Z803 Family history of malignant neoplasm of breast: Secondary | ICD-10-CM | POA: Diagnosis not present

## 2014-08-19 DIAGNOSIS — Z800 Family history of malignant neoplasm of digestive organs: Secondary | ICD-10-CM | POA: Diagnosis not present

## 2014-08-19 DIAGNOSIS — C50919 Malignant neoplasm of unspecified site of unspecified female breast: Secondary | ICD-10-CM

## 2014-08-19 DIAGNOSIS — L409 Psoriasis, unspecified: Secondary | ICD-10-CM | POA: Diagnosis not present

## 2014-08-19 LAB — COMPREHENSIVE METABOLIC PANEL
ALT: 16 U/L (ref 14–54)
AST: 25 U/L (ref 15–41)
Albumin: 4.1 g/dL (ref 3.5–5.0)
Alkaline Phosphatase: 39 U/L (ref 38–126)
Anion gap: 3 — ABNORMAL LOW (ref 5–15)
BILIRUBIN TOTAL: 0.6 mg/dL (ref 0.3–1.2)
BUN: 25 mg/dL — AB (ref 6–20)
CO2: 30 mmol/L (ref 22–32)
CREATININE: 1.1 mg/dL — AB (ref 0.44–1.00)
Calcium: 8.7 mg/dL — ABNORMAL LOW (ref 8.9–10.3)
Chloride: 106 mmol/L (ref 101–111)
GFR calc Af Amer: >60 mL/min (ref >60)
GFR calc non Af Amer: 57 mL/min — ABNORMAL LOW (ref >60)
GLUCOSE: 104 mg/dL — AB (ref 65–99)
Potassium: 4.6 mmol/L (ref 3.5–5.1)
SODIUM: 139 mmol/L (ref 135–145)
TOTAL PROTEIN: 7.3 g/dL (ref 6.5–8.1)

## 2014-08-19 LAB — CBC WITH DIFFERENTIAL/PLATELET
BASOS ABS: 0 10*3/uL (ref 0–0.1)
Basophils Relative: 1 %
EOS ABS: 0.2 10*3/uL (ref 0–0.7)
EOS PCT: 3 %
HCT: 43.3 % (ref 35.0–47.0)
Hemoglobin: 14.1 g/dL (ref 12.0–16.0)
LYMPHS ABS: 1 10*3/uL (ref 1.0–3.6)
LYMPHS PCT: 21 %
MCH: 30 pg (ref 26.0–34.0)
MCHC: 32.6 g/dL (ref 32.0–36.0)
MCV: 92 fL (ref 80.0–100.0)
Monocytes Absolute: 0.4 10*3/uL (ref 0.2–0.9)
Monocytes Relative: 8 %
NEUTROS PCT: 67 %
Neutro Abs: 3.2 10*3/uL (ref 1.4–6.5)
PLATELETS: 172 10*3/uL (ref 150–440)
RBC: 4.7 MIL/uL (ref 3.80–5.20)
RDW: 13.2 % (ref 11.5–14.5)
WBC: 4.7 10*3/uL (ref 3.6–11.0)

## 2014-08-20 ENCOUNTER — Encounter: Payer: Self-pay | Admitting: Oncology

## 2014-08-20 LAB — CANCER ANTIGEN 27.29: CA 27.29: 20.3 U/mL (ref 0.0–38.6)

## 2014-08-20 NOTE — Progress Notes (Signed)
[place] @ [facility] Telephone:(336) [phone]  Fax:(336) [place]: [birth year]  MR#: [id number]  IDH#:[id number]  Patient Care Team: Derinda Late, MD as PCP - General (Family Medicine) Robert Bellow, MD as Consulting Physician (General Surgery) Eusebio Me, MD as Referring Physician (Gynecology) Eusebio Me, MD (Unknown Physician Specialty)  CHIEF COMPLAINT:  Chief Complaint  Patient presents with  . Follow-up    Oncology History   carcinoma of breast, diagnosis.  In February of 2011.  Right breast.  ER positive.  PR positive, HER-2/neu, and not overexpressed  Status post 4 cycles of chemotherapy with Cytoxan, and Taxotere Finished chemotherapy on 07/07/2009  Status post bilateral   oopherectomy  and  hysterectomy in February of 2012   Tamoxifen June of 2011     Cancer of right breast    No flowsheet data found.  INTERVAL HISTORY:  54 year old lady with history of carcinoma breast status post bilateral mastectomy reconstructive surgery.  Patient recently had knee injury MRI scan was within normal limit.  Patient also had bilateral mammogram done.  She is finishing of 5 years of tamoxifen No hot flashes.  Appetite has been stable.  REVIEW OF SYSTEMS:   GENERAL:  Feels good.  Active.  No fevers, sweats or weight loss. PERFORMANCE STATUS (ECOG): 0 HEENT:  No visual changes, runny nose, sore throat, mouth sores or tenderness. Lungs: No shortness of breath or cough.  No hemoptysis. Cardiac:  No chest pain, palpitations, orthopnea, or PND. GI:  No nausea, vomiting, diarrhea, constipation, melena or hematochezia. GU:  No urgency, frequency, dysuria, or hematuria. Musculoskeletal:  No back pain.  No joint pain.  No muscle tenderness. Extremities:  No pain or swelling. Skin:  No rashes or skin changes. Neuro:  No headache, numbness or weakness, balance or coordination issues. Endocrine:  No diabetes, thyroid issues, hot flashes or night  sweats. Psych:  No mood changes, depression or anxiety. Pain:  No focal pain. Review of systems:  All other systems reviewed and found to be negative. As per HPI. Otherwise, a complete review of systems is negatve.  PAST MEDICAL HISTORY: Past Medical History  Diagnosis Date  . Psoriasis   . Thyroid disease     hypothyroidism  . 174.9 2011    Right breast T1c,N1 (mic),ER 90%; PR 90%, Her 2 neu not over expressed treated with chemotherapy and reconstruction  . Breast cancer 01/2009    Right with Chemo    PAST SURGICAL HISTORY: Past Surgical History  Procedure Laterality Date  . Tonsillectomy and adenoidectomy  1966  . Knee arthroscopy Right 2005  . Dilation and curettage of uterus  2008    polyp  . Abdominal hysterectomy  2011  . Craniotomy for avm  1998  . Portacath placement  2011  . Breast surgery Right 01/25/2009    mastectomy  . Breast reconstruction  2011, 2012    3  . Cecopexy  04-29-13  . Appendectomy  04/29/13  . Mastectomy Right 2011  . Breast biopsy Right 2010    Positive  . Augmentation mammaplasty Bilateral 08/2009    Reconstruction    FAMILY HISTORY Family History  Problem Relation Age of Onset  . Colon cancer Paternal Grandfather 61  . Breast cancer Maternal Aunt   . Cancer - Other Sister 86    [place]    ADVANCED DIRECTIVES Patient does have advance care directives  HEALTH MAINTENANCE: History  Substance Use Topics  . Smoking  status: Never Smoker   . Smokeless tobacco: Never Used  . Alcohol Use: No      Allergies  Allergen Reactions  . Codeine Nausea And Vomiting  . Demerol [Meperidine] Nausea Only    Current Outpatient Prescriptions  Medication Sig Dispense Refill  . ibuprofen (ADVIL,MOTRIN) 800 MG tablet Take 800 mg by mouth every 8 (eight) hours as needed.    Marland Kitchen levothyroxine (SYNTHROID, LEVOTHROID) 100 MCG tablet Take 1 tablet by mouth daily.    . Multiple Minerals-Vitamins (CALCIUM & VIT D3 BONE HEALTH PO) Take 1,200 mg by mouth  daily.    . polyethylene glycol powder (GLYCOLAX/MIRALAX) powder 255 grams one bottle for colonoscopy prep 255 g 0  . tamoxifen (NOLVADEX) 20 MG tablet Take 1 tablet by mouth daily.    . Calcium-Vitamin D 600-200 MG-UNIT per tablet Take by mouth.    . Multiple Vitamin (MULTIVITAMIN) capsule Take 1 capsule by mouth daily.     No current facility-administered medications for this visit.    OBJECTIVE:  Filed Vitals:   08/19/14 1102  BP: 111/68  Pulse: 61  Temp: 96.6 F (35.9 C)     Body mass index is 22.62 kg/(m^2).    ECOG FS:0 - Asymptomatic  PHYSICAL EXAM: GENERAL:  Well developed, well nourished, sitting comfortably in the exam room in no acute distress. MENTAL STATUS:  Alert and oriented to person, place and time. HEAD:     Normocephalic, atraumatic, face symmetric, no Cushingoid features. EYES:  .  Pupils equal round and reactive to light and accomodation.  No conjunctivitis or scleral icterus. ENT:  Oropharynx clear without lesion.  Tongue normal. Mucous membranes moist.  RESPIRATORY:  Clear to auscultation without rales, wheezes or rhonchi. CARDIOVASCULAR:  Regular rate and rhythm without murmur, rub or gallop. BREAST:  Bilateral mastectomy and reconstructive surgery ABDOMEN:  Soft, non-tender, with active bowel sounds, and no hepatosplenomegaly.  No masses. BACK:  No CVA tenderness.  No tenderness on percussion of the back or rib cage. SKIN:  No rashes, ulcers or lesions. EXTREMITIES: No edema, no skin discoloration or tenderness.  No palpable cords. LYMPH NODES: No palpable cervical, supraclavicular, axillary or inguinal adenopathy  NEUROLOGICAL: Unremarkable. PSYCH:  Appropriate.   LAB RESULTS:  Appointment on 08/19/2014  Component Date Value Ref Range Status  . WBC 08/19/2014 4.7  3.6 - 11.0 K/uL Final  . RBC 08/19/2014 4.70  3.80 - 5.20 MIL/uL Final  . Hemoglobin 08/19/2014 14.1  12.0 - 16.0 g/dL Final  . HCT 08/19/2014 43.3  35.0 - 47.0 % Final  . MCV  08/19/2014 92.0  80.0 - 100.0 fL Final  . MCH 08/19/2014 30.0  26.0 - 34.0 pg Final  . MCHC 08/19/2014 32.6  32.0 - 36.0 g/dL Final  . RDW 08/19/2014 13.2  11.5 - 14.5 % Final  . Platelets 08/19/2014 172  150 - 440 K/uL Final  . Neutrophils Relative % 08/19/2014 67   Final  . Neutro Abs 08/19/2014 3.2  1.4 - 6.5 K/uL Final  . Lymphocytes Relative 08/19/2014 21   Final  . Lymphs Abs 08/19/2014 1.0  1.0 - 3.6 K/uL Final  . Monocytes Relative 08/19/2014 8   Final  . Monocytes Absolute 08/19/2014 0.4  0.2 - 0.9 K/uL Final  . Eosinophils Relative 08/19/2014 3   Final  . Eosinophils Absolute 08/19/2014 0.2  0 - 0.7 K/uL Final  . Basophils Relative 08/19/2014 1   Final  . Basophils Absolute 08/19/2014 0.0  0 - 0.1 K/uL Final  .  Sodium 08/19/2014 139  135 - 145 mmol/L Final  . Potassium 08/19/2014 4.6  3.5 - 5.1 mmol/L Final  . Chloride 08/19/2014 106  101 - 111 mmol/L Final  . CO2 08/19/2014 30  22 - 32 mmol/L Final  . Glucose, Bld 08/19/2014 104* 65 - 99 mg/dL Final  . BUN 08/19/2014 25* 6 - 20 mg/dL Final  . Creatinine, Ser 08/19/2014 1.10* 0.44 - 1.00 mg/dL Final  . Calcium 08/19/2014 8.7* 8.9 - 10.3 mg/dL Final  . Total Protein 08/19/2014 7.3  6.5 - 8.1 g/dL Final  . Albumin 08/19/2014 4.1  3.5 - 5.0 g/dL Final  . AST 08/19/2014 25  15 - 41 U/L Final  . ALT 08/19/2014 16  14 - 54 U/L Final  . Alkaline Phosphatase 08/19/2014 39  38 - 126 U/L Final  . Total Bilirubin 08/19/2014 0.6  0.3 - 1.2 mg/dL Final  . GFR calc non Af Amer 08/19/2014 57* >60 mL/min Final  . GFR calc Af Amer 08/19/2014 >60  >60 mL/min Final   Comment: (NOTE) The eGFR has been calculated using the CKD EPI equation. This calculation has not been validated in all clinical situations. eGFR's persistently <60 mL/min signify possible Chronic Kidney Disease.   . Anion gap 08/19/2014 3* 5 - 15 Final  . CA 27.29 08/19/2014 20.3  0.0 - 38.6 U/mL Final   Comment: (NOTE) Bayer Centaur/ACS methodology Performed At: [facility] [address] [place], [place] [id number] Lindon Romp MD WU:[id number]       STUDIES: Mm Digital Screening W/ Implants Uni L  07/28/2014   CLINICAL DATA:  Screening.  EXAM: DIGITAL SCREENING UNILATERAL LEFT MAMMOGRAM WITH IMPLANTS AND CAD  COMPARISON:  Previous exam(s).  ACR Breast Density Category d: The breast tissue is extremely dense, which lowers the sensitivity of mammography.  FINDINGS: The patient has had a right mastectomy. There are no findings suspicious for malignancy. The patient has left retropectoral implant.  Images were processed with CAD.  IMPRESSION: No mammographic evidence of malignancy. A result letter of this screening mammogram will be mailed directly to the patient.  RECOMMENDATION: Screening mammogram in one year.  ([id number])  BI-RADS CATEGORY  1: Negative.   Electronically Signed   By: Nolon Nations M.D.   On: 07/28/2014 13:16    ASSESSMENT: Carcinoma of right breast with status post mastectomy and reconstructive surgery.  Mammogram of July 28, 2014 Acceptable limit    MEDICAL DECISION MAKING:  All lab data has been reviewed.  Tumor markers are stable. I discussed possibility of extended adjuvant therapy We will send patient's breast tissue for breast cancer index study and decide regarding further extended adjuvant therapy  Patient expressed understanding and was in agreement with this plan. She also understands that She can call clinic at any time with any questions, concerns, or complaints.    No matching staging information was found for the patient.  Forest Gleason, MD   08/20/2014 6:59 AM

## 2014-09-01 ENCOUNTER — Encounter: Payer: Self-pay | Admitting: Oncology

## 2014-09-13 ENCOUNTER — Other Ambulatory Visit: Payer: 59

## 2014-09-13 ENCOUNTER — Inpatient Hospital Stay
Admission: RE | Admit: 2014-09-13 | Discharge: 2014-09-13 | Disposition: A | Discharge status: Home / Self Care | Payer: 59 | Source: Ambulatory Visit

## 2014-09-13 HISTORY — DX: Other specified postprocedural states: Z98.890

## 2014-09-13 HISTORY — DX: Nausea with vomiting, unspecified: R11.2

## 2014-09-13 NOTE — Patient Instructions (Signed)
  Your procedure is scheduled on: Wednesday September 22, 2014. Report to Same Day Surgery. To find out your arrival time please call [phone] between 1PM - 3PM on Tuesday September 21, 2014.  Remember: Instructions that are not followed completely may result in serious medical risk, up to and including death, or upon the discretion of your surgeon and anesthesiologist your surgery may need to be rescheduled.    __x__ 1. Do not eat food or drink liquids after midnight. No gum chewing or hard candies.     ____ 2. No Alcohol for 24 hours before or after surgery.   ____ 3. Bring all medications with you on the day of surgery if instructed.    _x___ 4. Notify your doctor if there is any change in your medical condition     (cold, fever, infections).     Do not wear jewelry, make-up, hairpins, clips or nail polish.  Do not wear lotions, powders, or perfumes. You may wear deodorant.  Do not shave 48 hours prior to surgery. Men may shave face and neck.  Do not bring valuables to the hospital.    [facility] is not responsible for any belongings or valuables.               Contacts, dentures or bridgework may not be worn into surgery.  Leave your suitcase in the car. After surgery it may be brought to your room.  For patients admitted to the hospital, discharge time is determined by your treatment team.   Patients discharged the day of surgery will not be allowed to drive home.    Please read over the following fact sheets that you were given:   [facility] Preparing for Surgery  _x___ Take these medicines the morning of surgery with A SIP OF WATER:    1. levothyroxine (SYNTHROID, LEVOTHROID)   ____ Fleet Enema (as directed)   __x__ Use CHG Soap as directed  ____ Use inhalers on the day of surgery  ____ Stop metformin 2 days prior to surgery    ____ Take 1/2 of usual insulin dose the night before surgery and none on the morning of surgery.   ____ Stop Coumadin/Plavix/aspirin on  does not apply.  __x__ Stop Anti-inflammatories on 09/14/14.   __x__ Stop supplements until after surgery,Biotin 5000 MCG, Flaxseed, vitamin E, B complex.     ____ Bring C-Pap to the hospital.

## 2014-09-22 ENCOUNTER — Ambulatory Visit: Payer: 59 | Admitting: Anesthesiology

## 2014-09-22 ENCOUNTER — Ambulatory Visit
Admission: RE | Admit: 2014-09-22 | Discharge: 2014-09-22 | Disposition: A | Discharge status: Home / Self Care | Payer: 59 | Source: Ambulatory Visit | Attending: Orthopedic Surgery | Admitting: Orthopedic Surgery

## 2014-09-22 ENCOUNTER — Encounter: Payer: Self-pay | Admitting: Orthopedic Surgery

## 2014-09-22 ENCOUNTER — Encounter
Admission: RE | Disposition: A | Discharge status: Home / Self Care | Payer: Self-pay | Source: Ambulatory Visit | Attending: Orthopedic Surgery

## 2014-09-22 DIAGNOSIS — Z833 Family history of diabetes mellitus: Secondary | ICD-10-CM | POA: Insufficient documentation

## 2014-09-22 DIAGNOSIS — L409 Psoriasis, unspecified: Secondary | ICD-10-CM | POA: Insufficient documentation

## 2014-09-22 DIAGNOSIS — Z90710 Acquired absence of both cervix and uterus: Secondary | ICD-10-CM | POA: Diagnosis not present

## 2014-09-22 DIAGNOSIS — Z79899 Other long term (current) drug therapy: Secondary | ICD-10-CM | POA: Insufficient documentation

## 2014-09-22 DIAGNOSIS — Z823 Family history of stroke: Secondary | ICD-10-CM | POA: Insufficient documentation

## 2014-09-22 DIAGNOSIS — M8580 Other specified disorders of bone density and structure, unspecified site: Secondary | ICD-10-CM | POA: Insufficient documentation

## 2014-09-22 DIAGNOSIS — Z800 Family history of malignant neoplasm of digestive organs: Secondary | ICD-10-CM | POA: Diagnosis not present

## 2014-09-22 DIAGNOSIS — M23222 Derangement of posterior horn of medial meniscus due to old tear or injury, left knee: Secondary | ICD-10-CM | POA: Insufficient documentation

## 2014-09-22 DIAGNOSIS — E890 Postprocedural hypothyroidism: Secondary | ICD-10-CM | POA: Diagnosis not present

## 2014-09-22 DIAGNOSIS — M25562 Pain in left knee: Secondary | ICD-10-CM | POA: Diagnosis present

## 2014-09-22 DIAGNOSIS — Z885 Allergy status to narcotic agent status: Secondary | ICD-10-CM | POA: Insufficient documentation

## 2014-09-22 DIAGNOSIS — Z853 Personal history of malignant neoplasm of breast: Secondary | ICD-10-CM | POA: Insufficient documentation

## 2014-09-22 DIAGNOSIS — Z8249 Family history of ischemic heart disease and other diseases of the circulatory system: Secondary | ICD-10-CM | POA: Insufficient documentation

## 2014-09-22 HISTORY — PX: KNEE ARTHROSCOPY: SHX127

## 2014-09-22 SURGERY — ARTHROSCOPY, KNEE
Anesthesia: General | Laterality: Left

## 2014-09-22 MED ORDER — MORPHINE SULFATE 4 MG/ML IJ SOLN
INTRAMUSCULAR | Status: DC | PRN
  Administered 2014-09-22: 4 mg via INTRAMUSCULAR

## 2014-09-22 MED ORDER — BUPIVACAINE-EPINEPHRINE (PF) 0.25% -1:200000 IJ SOLN
INTRAMUSCULAR | Status: DC | PRN
  Administered 2014-09-22: 30 mL

## 2014-09-22 MED ORDER — PHENYLEPHRINE HCL 10 MG/ML IJ SOLN
INTRAMUSCULAR | Status: DC | PRN
  Administered 2014-09-22: 100 ug via INTRAVENOUS
  Administered 2014-09-22 (×2): 50 ug via INTRAVENOUS

## 2014-09-22 MED ORDER — ACETAMINOPHEN 10 MG/ML IV SOLN
INTRAVENOUS | Status: DC | PRN
  Administered 2014-09-22: 1000 mg via INTRAVENOUS

## 2014-09-22 MED ORDER — PROMETHAZINE HCL 25 MG/ML IJ SOLN
INTRAMUSCULAR | Status: AC
  Filled 2014-09-22: qty 1

## 2014-09-22 MED ORDER — HYDROCODONE-ACETAMINOPHEN 5-325 MG PO TABS: 1.0000 | ORAL_TABLET | ORAL | Status: DC | PRN

## 2014-09-22 MED ORDER — ONDANSETRON HCL 4 MG/2ML IJ SOLN
INTRAMUSCULAR | Status: DC | PRN
  Administered 2014-09-22: 4 mg via INTRAVENOUS

## 2014-09-22 MED ORDER — FENTANYL CITRATE (PF) 100 MCG/2ML IJ SOLN
INTRAMUSCULAR | Status: DC | PRN
  Administered 2014-09-22 (×2): 25 ug via INTRAVENOUS

## 2014-09-22 MED ORDER — ONDANSETRON HCL 4 MG/2ML IJ SOLN: 4.0000 mg | Freq: Once | INTRAMUSCULAR | Status: DC | PRN

## 2014-09-22 MED ORDER — FENTANYL CITRATE (PF) 100 MCG/2ML IJ SOLN
25.0000 ug | INTRAMUSCULAR | Status: DC | PRN
  Administered 2014-09-22 (×4): 25 ug via INTRAVENOUS

## 2014-09-22 MED ORDER — GLYCOPYRROLATE 0.2 MG/ML IJ SOLN
INTRAMUSCULAR | Status: DC | PRN
  Administered 2014-09-22: 0.2 mg via INTRAVENOUS

## 2014-09-22 MED ORDER — PROMETHAZINE HCL 25 MG/ML IJ SOLN
12.5000 mg | Freq: Four times a day (QID) | INTRAMUSCULAR | Status: DC | PRN
  Administered 2014-09-22: 12.5 mg via INTRAVENOUS

## 2014-09-22 MED ORDER — FAMOTIDINE 20 MG PO TABS
20.0000 mg | ORAL_TABLET | Freq: Once | ORAL | Status: AC
  Administered 2014-09-22: 20 mg via ORAL

## 2014-09-22 MED ORDER — LACTATED RINGERS IV SOLN
INTRAVENOUS | Status: DC
  Administered 2014-09-22 (×2): via INTRAVENOUS

## 2014-09-22 MED ORDER — FENTANYL CITRATE (PF) 100 MCG/2ML IJ SOLN
INTRAMUSCULAR | Status: AC
  Administered 2014-09-22: 25 ug via INTRAVENOUS
  Filled 2014-09-22: qty 2

## 2014-09-22 MED ORDER — MORPHINE SULFATE 4 MG/ML IJ SOLN
INTRAMUSCULAR | Status: AC
  Filled 2014-09-22: qty 1

## 2014-09-22 MED ORDER — SODIUM CHLORIDE 0.9 % IJ SOLN
INTRAMUSCULAR | Status: AC
  Filled 2014-09-22: qty 10

## 2014-09-22 MED ORDER — PROPOFOL 10 MG/ML IV BOLUS
INTRAVENOUS | Status: DC | PRN
  Administered 2014-09-22: 250 mg via INTRAVENOUS

## 2014-09-22 MED ORDER — LIDOCAINE HCL (CARDIAC) 20 MG/ML IV SOLN
INTRAVENOUS | Status: DC | PRN
  Administered 2014-09-22: 60 mg via INTRAVENOUS

## 2014-09-22 MED ORDER — MIDAZOLAM HCL 5 MG/5ML IJ SOLN
INTRAMUSCULAR | Status: DC | PRN
  Administered 2014-09-22: 2 mg via INTRAVENOUS

## 2014-09-22 MED ORDER — DEXAMETHASONE SODIUM PHOSPHATE 10 MG/ML IJ SOLN
INTRAMUSCULAR | Status: DC | PRN
  Administered 2014-09-22: 10 mg via INTRAVENOUS

## 2014-09-22 SURGICAL SUPPLY — 22 items
BLADE SHAVER 4.5 DBL SERAT CV (CUTTER) ×3 IMPLANT
BNDG ESMARK 6X12 TAN STRL LF (GAUZE/BANDAGES/DRESSINGS) ×3 IMPLANT
DRSG DERMACEA 8X12 NADH (GAUZE/BANDAGES/DRESSINGS) ×3 IMPLANT
DURAPREP 26ML APPLICATOR (WOUND CARE) ×6 IMPLANT
GAUZE SPONGE 4X4 12PLY STRL (GAUZE/BANDAGES/DRESSINGS) ×3 IMPLANT
GLOVE BIOGEL M STRL SZ7.5 (GLOVE) ×3 IMPLANT
GLOVE INDICATOR 8.0 STRL GRN (GLOVE) ×3 IMPLANT
GOWN STRL REUS W/ TWL LRG LVL3 (GOWN DISPOSABLE) ×1 IMPLANT
GOWN STRL REUS W/TWL LRG LVL3 (GOWN DISPOSABLE) ×2
GOWN STRL REUS W/TWL XL LVL4 (GOWN DISPOSABLE) ×3 IMPLANT
IV LACTATED RINGER IRRG 3000ML (IV SOLUTION) ×12
IV LR IRRIG 3000ML ARTHROMATIC (IV SOLUTION) ×6 IMPLANT
MANIFOLD NEPTUNE II (INSTRUMENTS) ×3 IMPLANT
PACK ARTHROSCOPY KNEE (MISCELLANEOUS) ×3 IMPLANT
SET TUBE SUCT SHAVER OUTFL 24K (TUBING) ×3 IMPLANT
SET TUBE TIP INTRA-ARTICULAR (MISCELLANEOUS) ×3 IMPLANT
STRAP SAFETY BODY (MISCELLANEOUS) ×3 IMPLANT
SUT ETHILON 3-0 FS-10 30 BLK (SUTURE) ×3
SUTURE EHLN 3-0 FS-10 30 BLK (SUTURE) ×1 IMPLANT
TUBING ARTHRO INFLOW-ONLY STRL (TUBING) ×3 IMPLANT
WAND HAND CNTRL MULTIVAC 50 (MISCELLANEOUS) ×3 IMPLANT
WRAP KNEE W/COLD PACKS 25.5X14 (SOFTGOODS) ×3 IMPLANT

## 2014-09-22 NOTE — Anesthesia Postprocedure Evaluation (Signed)
  Anesthesia Post-op Note  Patient: Susan Green  Procedure(s) Performed: Procedure(s): Left knee arthroscopy, partial medial menisectomy (Left)  Anesthesia type:General  Patient location: PACU  Post pain: Pain level controlled  Post assessment: Post-op Vital signs reviewed, Patient's Cardiovascular Status Stable, Respiratory Function Stable, Patent Airway and No signs of Nausea or vomiting  Post vital signs: Reviewed and stable  Last Vitals:  Filed Vitals:   09/22/14 1757  BP:   Pulse: 93  Temp:   Resp: 19    Level of consciousness: awake, alert  and patient cooperative  Complications: No apparent anesthesia complications

## 2014-09-22 NOTE — Discharge Instructions (Signed)
°  Instructions after Knee Arthroscopy  ° ° James P. Hooten, Jr., M.D.    ° Dept. of Orthopaedics & Sports Medicine ° [facility] ° [address] ° [place], [place]  [address] ° ° Phone: [phone]   Fax: [phone] ° ° °DIET: °• Drink plenty of non-alcoholic fluids & begin a light diet. °• Resume your normal diet the day after surgery. ° °ACTIVITY:  °• You may use crutches or a walker with weight-bearing as tolerated, unless instructed otherwise. °• You may wean yourself off of the walker or crutches as tolerated.  °• Begin doing gentle exercises. Exercising will reduce the pain and swelling, increase motion, and prevent muscle weakness.   °• Avoid strenuous activities or athletics for a minimum of 4-6 weeks after arthroscopic surgery. °• Do not drive or operate any equipment until instructed. ° °WOUND CARE:  °• Place one to two pillows under the knee the first day or two when sitting or lying.  °• Continue to use the ice packs periodically to reduce pain and swelling. °• The small incisions in your knee are closed with nylon stitches. The stitches will be removed in the office. °• The bulky dressing may be removed on the second day after surgery. DO NOT TOUCH THE STITCHES. Put a Band-Aid over each stitch. Do NOT use any ointments or creams on the incisions.  °• You may bathe or shower after the stitches are removed at the first office visit following surgery. ° °MEDICATIONS: °• You may resume your regular medications. °• Please take the pain medication as prescribed. °• Do not take pain medication on an empty stomach. °• Do not drive or drink alcoholic beverages when taking pain medications. ° °CALL THE OFFICE FOR: °• Temperature above 101 degrees °• Excessive bleeding or drainage on the dressing. °• Excessive swelling, coldness, or paleness of the toes. °• Persistent nausea and vomiting. ° °FOLLOW-UP:  °You should have an appointment to return to the office in 7-10 days after surgery.  AMBULATORY  SURGERY  °DISCHARGE INSTRUCTIONS ° ° °The drugs that you were given will stay in your system until tomorrow so for the next 24 hours you should not: ° °Drive an automobile °Make any legal decisions °Drink any alcoholic beverage ° ° °You may resume regular meals tomorrow.  Today it is better to start with liquids and gradually work up to solid foods. ° °You may eat anything you prefer, but it is better to start with liquids, then soup and crackers, and gradually work up to solid foods. ° ° °Please notify your doctor immediately if you have any unusual bleeding, trouble breathing, redness and pain at the surgery site, drainage, fever, or pain not relieved by medication. ° ° ° °Additional Instructions: ° ° ° ° ° ° ° °• Please contact your physician with any problems or Same Day Surgery at [phone], Monday through Friday 6 am to 4 pm, or [facility] [place] Main number at [phone]. °

## 2014-09-22 NOTE — Op Note (Signed)
OPERATIVE NOTE  DATE OF SURGERY:  09/22/2014  PATIENT NAME:  Susan Green   DOB: Oct 05, 1960  MRN: [id number]   PRE-OPERATIVE DIAGNOSIS:  Internal derangement of the left knee   POST-OPERATIVE DIAGNOSIS:   Tear of the posterior horn of the medial meniscus, left knee  PROCEDURE:  Left knee arthroscopy, partial medial menisicectomy  SURGEON:  Marciano Sequin., M.D.   ASSISTANT: none  ANESTHESIA: general  ESTIMATED BLOOD LOSS: Minimal  FLUIDS REPLACED: 1200 mL of crystalloid  TOURNIQUET TIME: not used   DRAINS: none  IMPLANTS UTILIZED: none  INDICATIONS FOR SURGERY: Susan Green is a 54 y.o. year old female who has been seen for complaints of left knee pain. MRI demonstrated findings consistent with meniscal pathology. After discussion of the risks and benefits of surgical intervention, the patient expressed understanding of the risks benefits and agree with plans for left knee arthroscopy.   PROCEDURE IN DETAIL: The patient was brought into the operating room and, after adequate general anesthesia was achieved, a tourniquet was applied to the left thigh and the leg was placed in the leg holder. All bony prominences were well padded. The patient's left knee was cleaned and prepped with alcohol and Duraprep and draped in the usual sterile fashion. A "timeout" was performed as per usual protocol. The anticipated portal sites were injected with 0.25% Marcaine with epinephrine. An anterolateral incision was made and a cannula was inserted. A small effusion was evacuated and the knee was distended with fluid using the pump. The scope was advanced down the medial gutter into the medial compartment. Under visualization with the scope, an anteromedial portal was created and a hooked probe was inserted. The medial meniscus was visualized and probed. There was a flap tear involving the posterior horn of the medial meniscus. The tear was debrided using meniscal punches and a 4.5 mm incisor  shaver. Contouring was performed using the 50 ArthroCare wand. The remaining rim of meniscus was visualized and probed and felt to be stable. The anterior horn was intact. The articular cartilage was visualized and was noted to be in excellent condition.  The scope was then advanced into the intercondylar notch. The anterior cruciate ligament was visualized and probed and felt to be intact. The scope was removed from the lateral portal and reinserted via the anteromedial portal to better visualize the lateral compartment. The lateral meniscus was visualized and probed. The lateral meniscus was intact and stable. The articular cartilage of the lateral compartment was visualized and noted to be in excellent condition. Finally, the scope was advanced so as to visualize the patellofemoral articulation. Good patellar tracking was appreciated. The patellofemoral articular surface was in excellent condition.  The knee was irrigated with copius amounts of fluid and suctioned dry. The anterolateral portal was re-approximated with #3-0 nylon. A combination of 0.25% Marcaine with epinephrine and 4 mg of Morphine were injected via the scope. The scope was removed and the anteromedial portal was re-approximated with #3-0 nylon. A sterile dressing was applied followed by application of an ice wrap.  The patient tolerated the procedure well and was transported to the PACU in stable condition.  James P. Holley Bouche., M.D.

## 2014-09-22 NOTE — H&P (Signed)
The patient has been re-examined, and the chart reviewed, and there have been no interval changes to the documented history and physical.    The risks, benefits, and alternatives have been discussed at length. The patient expressed understanding of the risks benefits and agreed with plans for surgical intervention.  James P. Hooten, Jr. M.D.    

## 2014-09-22 NOTE — OR Nursing (Signed)
Dr. Kayleen Memos contacted about patient's nausea, ordered Phenergan 12.5mg .  Currently patient resting quietly with no complaints, sleeping.

## 2014-09-22 NOTE — Transfer of Care (Signed)
Immediate Anesthesia Transfer of Care Note  Patient: Susan Green  Procedure(s) Performed: Procedure(s): Left knee arthroscopy, partial medial menisectomy (Left)  Patient Location: PACU  Anesthesia Type:General  Level of Consciousness: awake and patient cooperative  Airway & Oxygen Therapy: Patient Spontanous Breathing and Patient connected to face mask oxygen  Post-op Assessment: Report given to RN  Post vital signs: Reviewed and stable  Last Vitals:  Filed Vitals:   09/22/14 1728  BP: 113/69  Pulse: 96  Temp: 37 C  Resp: 15    Complications: No apparent anesthesia complications

## 2014-09-22 NOTE — Anesthesia Procedure Notes (Signed)
Procedure Name: LMA Insertion Date/Time: 09/22/2014 3:47 PM Performed by: Dionne Bucy Pre-anesthesia Checklist: Patient identified, Patient being monitored, Timeout performed, Emergency Drugs available and Suction available Patient Re-evaluated:Patient Re-evaluated prior to inductionOxygen Delivery Method: Circle system utilized Preoxygenation: Pre-oxygenation with 100% oxygen Intubation Type: IV induction Ventilation: Mask ventilation without difficulty LMA: LMA inserted LMA Size: 4.0 Tube type: Oral Number of attempts: 1 Placement Confirmation: positive ETCO2 and breath sounds checked- equal and bilateral Tube secured with: Tape Dental Injury: Teeth and Oropharynx as per pre-operative assessment

## 2014-09-22 NOTE — Anesthesia Preprocedure Evaluation (Signed)
Anesthesia Evaluation  Patient identified by MRN, date of birth, ID band Patient awake    Reviewed: Allergy & Precautions, NPO status , Patient's Chart, lab work & pertinent test results  History of Anesthesia Complications (+) PONV  Airway Mallampati: III  TM Distance: >3 FB Neck ROM: Full    Dental  (+) Teeth Intact   Pulmonary          Cardiovascular     Neuro/Psych    GI/Hepatic   Endo/Other  Hypothyroidism   Renal/GU      Musculoskeletal   Abdominal   Peds  Hematology   Anesthesia Other Findings   Reproductive/Obstetrics                             Anesthesia Physical Anesthesia Plan  ASA: II  Anesthesia Plan: General   Post-op Pain Management:    Induction: Intravenous  Airway Management Planned: LMA  Additional Equipment:   Intra-op Plan:   Post-operative Plan:   Informed Consent: I have reviewed the patients History and Physical, chart, labs and discussed the procedure including the risks, benefits and alternatives for the proposed anesthesia with the patient or authorized representative who has indicated his/her understanding and acceptance.     Plan Discussed with:   Anesthesia Plan Comments:         Anesthesia Quick Evaluation

## 2014-09-22 NOTE — Brief Op Note (Signed)
09/22/2014  5:28 PM  PATIENT:  Susan Green  54 y.o. female  PRE-OPERATIVE DIAGNOSIS:  INTERNAL DERANGEMENT LEFT KNEE  POST-OPERATIVE DIAGNOSIS:  Tear posterior horn medial meniscus   PROCEDURE:  Procedure(s): Left knee arthroscopy, partial medial menisectomy (Left)  SURGEON:  Surgeon(s) and Role:    * Dereck Leep, MD - Primary  ASSISTANTS: none   ANESTHESIA:   general  EBL:  Total I/O In: 1200 [I.V.:1200] Out: -   BLOOD ADMINISTERED:none  DRAINS: none   LOCAL MEDICATIONS USED:  MARCAINE     SPECIMEN:  No Specimen  DISPOSITION OF SPECIMEN:  N/A  COUNTS:  YES  TOURNIQUET:  not used  DICTATION: .Sales executive  PLAN OF CARE: Discharge to home after PACU  PATIENT DISPOSITION:  PACU - hemodynamically stable.   Delay start of Pharmacological VTE agent (>24hrs) due to surgical blood loss or risk of bleeding: not applicable

## 2014-11-22 ENCOUNTER — Other Ambulatory Visit: Payer: 59

## 2014-11-22 ENCOUNTER — Ambulatory Visit: Payer: 59 | Admitting: Oncology

## 2015-02-04 ENCOUNTER — Telehealth: Payer: Self-pay | Admitting: General Surgery

## 2015-02-04 NOTE — Telephone Encounter (Signed)
PT CAME BY TO SAY HELLO.SHE'S DOING GOOD. SHE HAS AN APPT TO SEE DR CHOKSI  ABOUT POSSIBLY STOPPING ANTI-HORMONAL THERAPY.SHE IS 6 YRS OUT FROM BEING DIAGNOSED WITH BR CANCER. HOWEVER SHE SAW HER DERMATOLOGIST &  FOUND SQ CELL CA ON HER LEFT LOWER LEG./MTH

## 2015-02-21 ENCOUNTER — Other Ambulatory Visit: Payer: 59

## 2015-02-21 ENCOUNTER — Ambulatory Visit: Payer: 59 | Admitting: Oncology

## 2015-03-01 ENCOUNTER — Inpatient Hospital Stay: Payer: 59 | Admitting: Oncology

## 2015-03-01 ENCOUNTER — Inpatient Hospital Stay: Payer: 59

## 2015-03-09 ENCOUNTER — Other Ambulatory Visit: Payer: 59

## 2015-03-09 ENCOUNTER — Ambulatory Visit: Payer: 59 | Admitting: Oncology

## 2015-04-13 ENCOUNTER — Inpatient Hospital Stay: Payer: 59

## 2015-04-13 ENCOUNTER — Inpatient Hospital Stay: Payer: 59 | Admitting: Oncology

## 2015-05-05 ENCOUNTER — Ambulatory Visit: Payer: 59 | Admitting: Oncology

## 2015-05-05 ENCOUNTER — Other Ambulatory Visit: Payer: 59

## 2015-05-12 ENCOUNTER — Other Ambulatory Visit: Payer: 59

## 2015-05-12 ENCOUNTER — Ambulatory Visit: Payer: 59 | Admitting: Oncology

## 2015-06-15 ENCOUNTER — Inpatient Hospital Stay: Payer: 59 | Admitting: Oncology

## 2015-06-15 ENCOUNTER — Inpatient Hospital Stay: Payer: 59

## 2015-06-30 ENCOUNTER — Other Ambulatory Visit: Payer: Self-pay | Admitting: Oncology

## 2015-07-14 ENCOUNTER — Inpatient Hospital Stay (HOSPITAL_BASED_OUTPATIENT_CLINIC_OR_DEPARTMENT_OTHER): Payer: 59 | Admitting: Oncology

## 2015-07-14 ENCOUNTER — Inpatient Hospital Stay: Payer: 59 | Attending: Oncology

## 2015-07-14 VITALS — BP 127/89 | HR 68 | Temp 96.3°F | Resp 18 | Wt 135.8 lb

## 2015-07-14 DIAGNOSIS — Z800 Family history of malignant neoplasm of digestive organs: Secondary | ICD-10-CM | POA: Insufficient documentation

## 2015-07-14 DIAGNOSIS — Z9221 Personal history of antineoplastic chemotherapy: Secondary | ICD-10-CM | POA: Diagnosis not present

## 2015-07-14 DIAGNOSIS — C50911 Malignant neoplasm of unspecified site of right female breast: Secondary | ICD-10-CM

## 2015-07-14 DIAGNOSIS — Z808 Family history of malignant neoplasm of other organs or systems: Secondary | ICD-10-CM | POA: Insufficient documentation

## 2015-07-14 DIAGNOSIS — E039 Hypothyroidism, unspecified: Secondary | ICD-10-CM

## 2015-07-14 DIAGNOSIS — Z9013 Acquired absence of bilateral breasts and nipples: Secondary | ICD-10-CM

## 2015-07-14 DIAGNOSIS — Z90710 Acquired absence of both cervix and uterus: Secondary | ICD-10-CM | POA: Insufficient documentation

## 2015-07-14 DIAGNOSIS — Z803 Family history of malignant neoplasm of breast: Secondary | ICD-10-CM | POA: Insufficient documentation

## 2015-07-14 DIAGNOSIS — Z90722 Acquired absence of ovaries, bilateral: Secondary | ICD-10-CM

## 2015-07-14 DIAGNOSIS — Z853 Personal history of malignant neoplasm of breast: Secondary | ICD-10-CM | POA: Diagnosis not present

## 2015-07-14 LAB — CBC WITH DIFFERENTIAL/PLATELET
BASOS ABS: 0.1 10*3/uL (ref 0–0.1)
Basophils Relative: 1 %
EOS ABS: 0.1 10*3/uL (ref 0–0.7)
Eosinophils Relative: 1 %
HCT: 42.9 % (ref 35.0–47.0)
HEMOGLOBIN: 14.5 g/dL (ref 12.0–16.0)
LYMPHS ABS: 0.9 10*3/uL — AB (ref 1.0–3.6)
Lymphocytes Relative: 12 %
MCH: 30.2 pg (ref 26.0–34.0)
MCHC: 33.9 g/dL (ref 32.0–36.0)
MCV: 89.2 fL (ref 80.0–100.0)
Monocytes Absolute: 0.5 10*3/uL (ref 0.2–0.9)
Monocytes Relative: 7 %
NEUTROS PCT: 79 %
Neutro Abs: 5.7 10*3/uL (ref 1.4–6.5)
Platelets: 168 10*3/uL (ref 150–440)
RBC: 4.81 MIL/uL (ref 3.80–5.20)
RDW: 13.2 % (ref 11.5–14.5)
WBC: 7.2 10*3/uL (ref 3.6–11.0)

## 2015-07-14 LAB — COMPREHENSIVE METABOLIC PANEL
ALBUMIN: 4.3 g/dL (ref 3.5–5.0)
ALT: 17 U/L (ref 14–54)
ANION GAP: 6 (ref 5–15)
AST: 28 U/L (ref 15–41)
Alkaline Phosphatase: 42 U/L (ref 38–126)
BUN: 30 mg/dL — ABNORMAL HIGH (ref 6–20)
CHLORIDE: 103 mmol/L (ref 101–111)
CO2: 28 mmol/L (ref 22–32)
Calcium: 9.3 mg/dL (ref 8.9–10.3)
Creatinine, Ser: 1.2 mg/dL — ABNORMAL HIGH (ref 0.44–1.00)
GFR calc non Af Amer: 51 mL/min — ABNORMAL LOW (ref >60)
GFR, EST AFRICAN AMERICAN: 59 mL/min — AB (ref >60)
GLUCOSE: 96 mg/dL (ref 65–99)
Potassium: 4 mmol/L (ref 3.5–5.1)
SODIUM: 137 mmol/L (ref 135–145)
Total Bilirubin: 0.5 mg/dL (ref 0.3–1.2)
Total Protein: 7.5 g/dL (ref 6.5–8.1)

## 2015-07-15 ENCOUNTER — Encounter: Payer: Self-pay | Admitting: Oncology

## 2015-07-15 LAB — CANCER ANTIGEN 27.29: CA 27.29: 24.8 U/mL (ref 0.0–38.6)

## 2015-07-15 NOTE — Progress Notes (Signed)
[place] @ [facility] [phone]  Fax:([phone]) [place]: 09-20-1960  MR#: [id number]  JJK#:[id number]  Patient Care Team: Derinda Late, MD as PCP - General (Family Medicine) Robert Bellow, MD as Consulting Physician (General Surgery) Eusebio Me, MD as Referring Physician (Gynecology) Eusebio Me, MD (Unknown Physician Specialty)  CHIEF COMPLAINT:  Chief Complaint  Patient presents with  . Breast Cancer   Oncology History   carcinoma of breast, diagnosis.  In February of 2011.  Right breast.  ER positive.  PR positive, HER-2/neu, and not overexpressed  Status post 4 cycles of chemotherapy with Cytoxan, and Taxotere Finished chemotherapy on 07/07/2009  Status post bilateral   oopherectomy  and  hysterectomy in February of 2012   Tamoxifen June of 2011      2. Breast cancer index revealed LATE RECURRENCE CHANCES OUT OF 1.2% and low likelihood of benefit from continuing anti-hormonal therapy.  So anti hormonr will be discontinued after 5 years of treatment   INTERVAL HISTORY:  55 year old lady with history of carcinoma breast status post bilateral mastectomy reconstructive surgery.  Patient recently had knee injury MRI scan was within normal limit.  Patient also had bilateral mammogram done.  She is finishing of 5 years of tamoxifen No hot flashes.  Appetite has been stable. .  Patient is here for further follow-up.  REVIEW OF SYSTEMS:   GENERAL:  Feels good.  Active.  No fevers, sweats or weight loss. PERFORMANCE STATUS (ECOG): 0 HEENT:  No visual changes, runny nose, sore throat, mouth sores or tenderness. Lungs: No shortness of breath or cough.  No hemoptysis. Cardiac:  No chest pain, palpitations, orthopnea, or PND. GI:  No nausea, vomiting, diarrhea, constipation, melena or hematochezia. GU:  No urgency, frequency, dysuria, or hematuria. Musculoskeletal:  No back pain.  No joint pain.  No muscle  tenderness. Extremities:  No pain or swelling. Skin:  No rashes or skin changes. Neuro:  No headache, numbness or weakness, balance or coordination issues. Endocrine:  No diabetes, thyroid issues, hot flashes or night sweats. Psych:  No mood changes, depression or anxiety. Pain:  No focal pain. Review of systems:  All other systems reviewed and found to be negative. As per HPI. Otherwise, a complete review of systems is negatve.  PAST MEDICAL HISTORY: Past Medical History  Diagnosis Date  . Psoriasis   . Thyroid disease     hypothyroidism  . PONV (postoperative nausea and vomiting)     very naseated  . 174.9 2011    Right breast T1c,N1 (mic),ER 90%; PR 90%, Her 2 neu not over expressed treated with chemotherapy and reconstruction  . Breast cancer ([place]) 01/2009    Right with Chemo    PAST SURGICAL HISTORY: Past Surgical History  Procedure Laterality Date  . Tonsillectomy and adenoidectomy  1966  . Knee arthroscopy Right 2005  . Dilation and curettage of uterus  2008    polyp  . Abdominal hysterectomy  2011  . Craniotomy for avm  1998  . Portacath placement  2011  . Breast surgery Right 01/25/2009    mastectomy  . Breast reconstruction  2011, 2012    3  . Cecopexy  04-29-13  . Appendectomy  04/29/13  . Mastectomy Right 2011  . Breast biopsy Right 2010    Positive  . Augmentation mammaplasty Bilateral 08/2009    Reconstruction  . Breast reconstruction Right 2011  . Breast surgery Left 2011  made to match the right breast  . Knee arthroscopy Left 09/22/2014    Procedure: Left knee arthroscopy, partial medial menisectomy;  Surgeon: Dereck Leep, MD;  Location: [facility] ORS;  Service: Orthopedics;  Laterality: Left;    FAMILY HISTORY Family History  Problem Relation Age of Onset  . Colon cancer Paternal Grandfather 11  . Breast cancer Maternal Aunt   . Cancer - Other Sister 46    Vulvar    ADVANCED DIRECTIVES Patient does have advance care directives  HEALTH  MAINTENANCE: Social History  Substance Use Topics  . Smoking status: Never Smoker   . Smokeless tobacco: Never Used  . Alcohol Use: No      Allergies  Allergen Reactions  . Codeine Nausea And Vomiting  . Demerol [Meperidine] Nausea Only    Current Outpatient Prescriptions  Medication Sig Dispense Refill  . b complex vitamins tablet Take 2 tablets by mouth daily.    . Biotin 5000 MCG TABS Take 1 tablet by mouth.    Marland Kitchen CALCIUM-MAGNESIUM PO Take 1 tablet by mouth.    . Calcium-Vitamin D 600-200 MG-UNIT per tablet Take 1 tablet by mouth daily.     . Flaxseed, Linseed, (FLAX SEED OIL) 1300 MG CAPS Take 2 capsules by mouth daily with lunch.    . levothyroxine (SYNTHROID, LEVOTHROID) 100 MCG tablet Take 1 tablet by mouth daily.    . Multiple Minerals-Vitamins (CALCIUM & VIT D3 BONE HEALTH PO) Take 1,200 mg by mouth daily.    . Multiple Vitamin (MULTIVITAMIN) capsule Take 1 capsule by mouth daily.    . Multiple Vitamins-Minerals (HAIR SKIN AND NAILS FORMULA) TABS Take 1 tablet by mouth.    . vitamin E 400 UNIT capsule Take 400 Units by mouth daily.     No current facility-administered medications for this visit.    OBJECTIVE:  Filed Vitals:   07/14/15 1401  BP: 127/89  Pulse: 68  Temp: 96.3 F (35.7 C)  Resp: 18     Body mass index is 23.3 kg/(m^2).    ECOG FS:0 - Asymptomatic  PHYSICAL EXAM: GENERAL:  Well developed, well nourished, sitting comfortably in the exam room in no acute distress. MENTAL STATUS:  Alert and oriented to person, place and time. HEAD:     Normocephalic, atraumatic, face symmetric, no Cushingoid features. EYES:  .  Pupils equal round and reactive to light and accomodation.  No conjunctivitis or scleral icterus. ENT:  Oropharynx clear without lesion.  Tongue normal. Mucous membranes moist.  RESPIRATORY:  Clear to auscultation without rales, wheezes or rhonchi. CARDIOVASCULAR:  Regular rate and rhythm without murmur, rub or gallop. BREAST:  Bilateral  mastectomy and reconstructive surgery ABDOMEN:  Soft, non-tender, with active bowel sounds, and no hepatosplenomegaly.  No masses. BACK:  No CVA tenderness.  No tenderness on percussion of the back or rib cage. SKIN:  No rashes, ulcers or lesions. EXTREMITIES: No edema, no skin discoloration or tenderness.  No palpable cords. LYMPH NODES: No palpable cervical, supraclavicular, axillary or inguinal adenopathy  NEUROLOGICAL: Unremarkable. PSYCH:  Appropriate.   LAB RESULTS:  Appointment on 07/14/2015  Component Date Value Ref Range Status  . WBC 07/14/2015 7.2  3.6 - 11.0 K/uL Final  . RBC 07/14/2015 4.81  3.80 - 5.20 MIL/uL Final  . Hemoglobin 07/14/2015 14.5  12.0 - 16.0 g/dL Final  . HCT 07/14/2015 42.9  35.0 - 47.0 % Final  . MCV 07/14/2015 89.2  80.0 - 100.0 fL Final  . MCH 07/14/2015 30.2  26.0 -  34.0 pg Final  . MCHC 07/14/2015 33.9  32.0 - 36.0 g/dL Final  . RDW 07/14/2015 13.2  11.5 - 14.5 % Final  . Platelets 07/14/2015 168  150 - 440 K/uL Final  . Neutrophils Relative % 07/14/2015 79   Final  . Neutro Abs 07/14/2015 5.7  1.4 - 6.5 K/uL Final  . Lymphocytes Relative 07/14/2015 12   Final  . Lymphs Abs 07/14/2015 0.9* 1.0 - 3.6 K/uL Final  . Monocytes Relative 07/14/2015 7   Final  . Monocytes Absolute 07/14/2015 0.5  0.2 - 0.9 K/uL Final  . Eosinophils Relative 07/14/2015 1   Final  . Eosinophils Absolute 07/14/2015 0.1  0 - 0.7 K/uL Final  . Basophils Relative 07/14/2015 1   Final  . Basophils Absolute 07/14/2015 0.1  0 - 0.1 K/uL Final  . Sodium 07/14/2015 137  135 - 145 mmol/L Final  . Potassium 07/14/2015 4.0  3.5 - 5.1 mmol/L Final  . Chloride 07/14/2015 103  101 - 111 mmol/L Final  . CO2 07/14/2015 28  22 - 32 mmol/L Final  . Glucose, Bld 07/14/2015 96  65 - 99 mg/dL Final  . BUN 07/14/2015 30* 6 - 20 mg/dL Final  . Creatinine, Ser 07/14/2015 1.20* 0.44 - 1.00 mg/dL Final  . Calcium 07/14/2015 9.3  8.9 - 10.3 mg/dL Final  . Total Protein 07/14/2015 7.5  6.5 -  8.1 g/dL Final  . Albumin 07/14/2015 4.3  3.5 - 5.0 g/dL Final  . AST 07/14/2015 28  15 - 41 U/L Final  . ALT 07/14/2015 17  14 - 54 U/L Final  . Alkaline Phosphatase 07/14/2015 42  38 - 126 U/L Final  . Total Bilirubin 07/14/2015 0.5  0.3 - 1.2 mg/dL Final  . GFR calc non Af Amer 07/14/2015 51* >60 mL/min Final  . GFR calc Af Amer 07/14/2015 59* >60 mL/min Final   Comment: (NOTE) The eGFR has been calculated using the CKD EPI equation. This calculation has not been validated in all clinical situations. eGFR's persistently <60 mL/min signify possible Chronic Kidney Disease.   . Anion gap 07/14/2015 6  5 - 15 Final  . CA 27.29 07/14/2015 24.8  0.0 - 38.6 U/mL Final   Comment: (NOTE) [employer]ayer Cent[facility]/ACS methodology Performed At: [facility] [place], [place] [id number] Lindon Romp MD EL:[id number]       STUDIES: No results found.  ASSESSMENT: Carcinoma of right breast with status post mastectomy and reconstructive surgery.  Mammogram of July 28, 2014 Acceptable limit  Mammogram   has been scheduled still in June of 2017  MEDICAL DECISION MAKING:  Lab data has been reviewed. No abnormality detected Breast  cancer index revealed 1.2% risk of later recurrence and low likelihood of benefit for continuing anti-hormonal therapy.  Patient will be now evaluated every year.  She is aware of my retirement plan and will be seen by my associate Dr. B   Patient expressed understanding and was in agreement with this plan. She also understands that She can call clinic at any time with any questions, concerns, or complaints.    No matching staging information was found for the patient.  Forest Gleason, MD   07/15/2015 7:42 AM

## 2015-08-01 ENCOUNTER — Ambulatory Visit: Admission: RE | Admit: 2015-08-01 | Payer: 59 | Source: Ambulatory Visit

## 2015-08-04 ENCOUNTER — Ambulatory Visit: Payer: 59 | Attending: Oncology

## 2015-08-22 ENCOUNTER — Ambulatory Visit
Admission: RE | Admit: 2015-08-22 | Discharge: 2015-08-22 | Disposition: A | Discharge status: Home / Self Care | Payer: 59 | Source: Ambulatory Visit | Attending: Oncology | Admitting: Oncology

## 2015-08-22 ENCOUNTER — Other Ambulatory Visit: Payer: Self-pay | Admitting: Oncology

## 2015-08-22 DIAGNOSIS — C50911 Malignant neoplasm of unspecified site of right female breast: Secondary | ICD-10-CM

## 2015-08-22 DIAGNOSIS — Z1231 Encounter for screening mammogram for malignant neoplasm of breast: Secondary | ICD-10-CM | POA: Diagnosis not present

## 2016-05-01 ENCOUNTER — Encounter: Payer: Self-pay | Admitting: *Deleted

## 2016-05-02 ENCOUNTER — Ambulatory Visit: Payer: Self-pay | Admitting: General Surgery

## 2016-05-08 ENCOUNTER — Inpatient Hospital Stay: Payer: Self-pay

## 2016-05-08 ENCOUNTER — Ambulatory Visit (INDEPENDENT_AMBULATORY_CARE_PROVIDER_SITE_OTHER): Payer: 59 | Admitting: General Surgery

## 2016-05-08 ENCOUNTER — Encounter: Payer: Self-pay | Admitting: General Surgery

## 2016-05-08 VITALS — BP 110/72 | HR 68 | Resp 12 | Ht 64.0 in | Wt 139.0 lb

## 2016-05-08 DIAGNOSIS — R2232 Localized swelling, mass and lump, left upper limb: Secondary | ICD-10-CM | POA: Diagnosis not present

## 2016-05-08 NOTE — Progress Notes (Signed)
Patient ID: Susan Green, female   DOB: 02/15/1961, 56 y.o.   MRN: [id number]  Chief Complaint  Patient presents with  . Other    knot on arm    HPI Susan Green is a 56 y.o. female here for evaluation of a knot on her left arm anticubital area. She first noticed this about 3-4 weeks ago. She does state it is a little "sore". She also has noticed a knot left lateral foot about 2 weeks ago. Denies any injury or trauma. She has been lifting weights at the gym. She states her massage therapist found a fatty tumor on her back.  HPI  Past Medical History:  Diagnosis Date  . 174.9 2011   Right breast T1c,N1 (mic),ER 90%; PR 90%, Her 2 neu not over expressed treated with chemotherapy and reconstruction  . Breast cancer ([place]) 01/2009   Right with Chemo  . PONV (postoperative nausea and vomiting)    very naseated  . Psoriasis   . Thyroid disease    hypothyroidism    Past Surgical History:  Procedure Laterality Date  . ABDOMINAL HYSTERECTOMY  2011  . APPENDECTOMY  04/29/13  . AUGMENTATION MAMMAPLASTY Bilateral 08/2009   Reconstruction  . BREAST BIOPSY Right 2010   Positive  . BREAST RECONSTRUCTION  2011, 2012   3  . BREAST RECONSTRUCTION Right 2011  . BREAST SURGERY Right 01/25/2009   mastectomy  . BREAST SURGERY Left 2011   made to match the right breast  . cecopexy  04-29-13  . CRANIOTOMY FOR AVM  1998  . DILATION AND CURETTAGE OF UTERUS  2008   polyp  . KNEE ARTHROSCOPY Right 2005  . KNEE ARTHROSCOPY Left 09/22/2014   Procedure: Left knee arthroscopy, partial medial menisectomy;  Surgeon: Dereck Leep, MD;  Location: [facility] ORS;  Service: Orthopedics;  Laterality: Left;  Marland Kitchen MASTECTOMY Right 2011  . PORTACATH PLACEMENT  2011  . TONSILLECTOMY AND ADENOIDECTOMY  1966    Family History  Problem Relation Age of Onset  . Colon cancer Paternal Grandfather 69  . Breast cancer Maternal Aunt   . Cancer - Other Sister 63    Vulvar    Social History Social History  Substance  Use Topics  . Smoking status: Never Smoker  . Smokeless tobacco: Never Used  . Alcohol use No    Allergies  Allergen Reactions  . Codeine Nausea And Vomiting  . Demerol [Meperidine] Nausea Only    Current Outpatient Prescriptions  Medication Sig Dispense Refill  . b complex vitamins tablet Take 2 tablets by mouth daily.    . Biotin 5000 MCG TABS Take 1 tablet by mouth.    Marland Kitchen CALCIUM-MAGNESIUM PO Take 1 tablet by mouth.    . Calcium-Vitamin D 600-200 MG-UNIT per tablet Take 1 tablet by mouth daily.     . Flaxseed, Linseed, (FLAX SEED OIL) 1300 MG CAPS Take 2 capsules by mouth daily with lunch.    . levothyroxine (SYNTHROID, LEVOTHROID) 100 MCG tablet Take 1 tablet by mouth daily.    . Multiple Minerals-Vitamins (CALCIUM & VIT D3 BONE HEALTH PO) Take 1,200 mg by mouth daily.    . Multiple Vitamin (MULTIVITAMIN) capsule Take 1 capsule by mouth daily.    . Multiple Vitamins-Minerals (HAIR SKIN AND NAILS FORMULA) TABS Take 1 tablet by mouth.    . vitamin E 400 UNIT capsule Take 400 Units by mouth daily.     No current facility-administered medications for this visit.     Review  of Systems Review of Systems  Constitutional: Negative.   Respiratory: Negative.   Cardiovascular: Negative.     Blood pressure 110/72, pulse 68, resp. rate 12, height 5\' 4"  (1.626 m), weight 139 lb (63 kg).  Physical Exam Physical Exam  Constitutional: She is oriented to person, place, and time. She appears well-developed and well-nourished.  Pulmonary/Chest:      Musculoskeletal:       Arms:      Feet:  Neurological: She is alert and oriented to person, place, and time.  Skin: Skin is warm and dry.  2 cm smooth nodule medial aspect of left anticubical fossa.   Psychiatric: Her behavior is normal.    Data Reviewed Ultrasound examination of the area of focal thickening in the superior medial aspect of the left antecubital fossa was undertaken. No soft tissue mass corresponding to the palpable  findings were identified. The underlying muscle bodies were all normal. The brachial vein is patent by both compression and distal augmentation. The brachial artery is normal.  Assessment    Focal thickening without ultrasound correlate.    Plan         Monitor the area. The patient is aware to call back for any questions or new concerns.  As the patient is a runner, she may benefit from podiatric assessment of the focal thickening below the left fifth metatarsal.   This information has been scribed by Karie Fetch RN, BSN,BC.   Robert Bellow 05/09/2016, 8:50 PM

## 2016-05-08 NOTE — Patient Instructions (Signed)
The patient is aware to call back for any questions or concerns.  

## 2016-05-09 DIAGNOSIS — R2232 Localized swelling, mass and lump, left upper limb: Secondary | ICD-10-CM | POA: Insufficient documentation

## 2016-06-01 DIAGNOSIS — D649 Anemia, unspecified: Secondary | ICD-10-CM | POA: Diagnosis not present

## 2016-06-01 DIAGNOSIS — E890 Postprocedural hypothyroidism: Secondary | ICD-10-CM | POA: Diagnosis not present

## 2016-06-01 DIAGNOSIS — E039 Hypothyroidism, unspecified: Secondary | ICD-10-CM | POA: Diagnosis not present

## 2016-06-01 DIAGNOSIS — Z13220 Encounter for screening for lipoid disorders: Secondary | ICD-10-CM | POA: Diagnosis not present

## 2016-06-01 DIAGNOSIS — Z0000 Encounter for general adult medical examination without abnormal findings: Secondary | ICD-10-CM | POA: Diagnosis not present

## 2016-06-01 DIAGNOSIS — Z131 Encounter for screening for diabetes mellitus: Secondary | ICD-10-CM | POA: Diagnosis not present

## 2016-06-01 DIAGNOSIS — M8580 Other specified disorders of bone density and structure, unspecified site: Secondary | ICD-10-CM | POA: Diagnosis not present

## 2016-06-06 DIAGNOSIS — E039 Hypothyroidism, unspecified: Secondary | ICD-10-CM | POA: Diagnosis not present

## 2016-06-15 DIAGNOSIS — M8588 Other specified disorders of bone density and structure, other site: Secondary | ICD-10-CM | POA: Diagnosis not present

## 2016-06-26 DIAGNOSIS — M21622 Bunionette of left foot: Secondary | ICD-10-CM | POA: Diagnosis not present

## 2016-06-26 DIAGNOSIS — D2371 Other benign neoplasm of skin of right lower limb, including hip: Secondary | ICD-10-CM | POA: Diagnosis not present

## 2016-06-26 DIAGNOSIS — M79672 Pain in left foot: Secondary | ICD-10-CM | POA: Diagnosis not present

## 2016-06-26 DIAGNOSIS — M79671 Pain in right foot: Secondary | ICD-10-CM | POA: Diagnosis not present

## 2016-07-13 ENCOUNTER — Inpatient Hospital Stay: Payer: 59 | Admitting: Internal Medicine

## 2016-07-13 ENCOUNTER — Inpatient Hospital Stay: Payer: 59

## 2016-07-20 ENCOUNTER — Inpatient Hospital Stay: Payer: 59

## 2016-07-20 ENCOUNTER — Inpatient Hospital Stay: Payer: 59 | Admitting: Oncology

## 2016-08-07 ENCOUNTER — Other Ambulatory Visit: Payer: 59

## 2016-08-07 ENCOUNTER — Ambulatory Visit: Payer: 59 | Admitting: Oncology

## 2016-08-09 ENCOUNTER — Inpatient Hospital Stay: Payer: 59 | Admitting: Oncology

## 2016-08-09 ENCOUNTER — Inpatient Hospital Stay: Payer: 59

## 2016-08-23 ENCOUNTER — Other Ambulatory Visit: Payer: Self-pay | Admitting: *Deleted

## 2016-08-23 DIAGNOSIS — C50911 Malignant neoplasm of unspecified site of right female breast: Secondary | ICD-10-CM

## 2016-08-24 ENCOUNTER — Encounter (INDEPENDENT_AMBULATORY_CARE_PROVIDER_SITE_OTHER): Payer: Self-pay

## 2016-08-24 ENCOUNTER — Inpatient Hospital Stay: Payer: 59 | Attending: Internal Medicine

## 2016-08-24 ENCOUNTER — Inpatient Hospital Stay (HOSPITAL_BASED_OUTPATIENT_CLINIC_OR_DEPARTMENT_OTHER): Payer: 59 | Admitting: Internal Medicine

## 2016-08-24 DIAGNOSIS — Z90722 Acquired absence of ovaries, bilateral: Secondary | ICD-10-CM | POA: Insufficient documentation

## 2016-08-24 DIAGNOSIS — M8580 Other specified disorders of bone density and structure, unspecified site: Secondary | ICD-10-CM | POA: Diagnosis not present

## 2016-08-24 DIAGNOSIS — Z9221 Personal history of antineoplastic chemotherapy: Secondary | ICD-10-CM | POA: Diagnosis not present

## 2016-08-24 DIAGNOSIS — Z808 Family history of malignant neoplasm of other organs or systems: Secondary | ICD-10-CM | POA: Insufficient documentation

## 2016-08-24 DIAGNOSIS — E039 Hypothyroidism, unspecified: Secondary | ICD-10-CM

## 2016-08-24 DIAGNOSIS — Z800 Family history of malignant neoplasm of digestive organs: Secondary | ICD-10-CM | POA: Diagnosis not present

## 2016-08-24 DIAGNOSIS — Z923 Personal history of irradiation: Secondary | ICD-10-CM | POA: Diagnosis not present

## 2016-08-24 DIAGNOSIS — Z853 Personal history of malignant neoplasm of breast: Secondary | ICD-10-CM | POA: Insufficient documentation

## 2016-08-24 DIAGNOSIS — C50811 Malignant neoplasm of overlapping sites of right female breast: Secondary | ICD-10-CM | POA: Insufficient documentation

## 2016-08-24 DIAGNOSIS — Z79899 Other long term (current) drug therapy: Secondary | ICD-10-CM | POA: Insufficient documentation

## 2016-08-24 DIAGNOSIS — L409 Psoriasis, unspecified: Secondary | ICD-10-CM | POA: Diagnosis not present

## 2016-08-24 DIAGNOSIS — Z90710 Acquired absence of both cervix and uterus: Secondary | ICD-10-CM

## 2016-08-24 DIAGNOSIS — Z803 Family history of malignant neoplasm of breast: Secondary | ICD-10-CM | POA: Insufficient documentation

## 2016-08-24 DIAGNOSIS — C50911 Malignant neoplasm of unspecified site of right female breast: Secondary | ICD-10-CM

## 2016-08-24 DIAGNOSIS — Z170 Estrogen receptor positive status [ER+]: Principal | ICD-10-CM

## 2016-08-24 LAB — CBC WITH DIFFERENTIAL/PLATELET
BASOS ABS: 0.1 10*3/uL (ref 0–0.1)
Basophils Relative: 1 %
EOS PCT: 4 %
Eosinophils Absolute: 0.3 10*3/uL (ref 0–0.7)
HEMATOCRIT: 44.9 % (ref 35.0–47.0)
HEMOGLOBIN: 15.6 g/dL (ref 12.0–16.0)
LYMPHS ABS: 1 10*3/uL (ref 1.0–3.6)
LYMPHS PCT: 17 %
MCH: 30.7 pg (ref 26.0–34.0)
MCHC: 34.8 g/dL (ref 32.0–36.0)
MCV: 88.3 fL (ref 80.0–100.0)
Monocytes Absolute: 0.5 10*3/uL (ref 0.2–0.9)
Monocytes Relative: 8 %
NEUTROS ABS: 4.1 10*3/uL (ref 1.4–6.5)
NEUTROS PCT: 70 %
PLATELETS: 196 10*3/uL (ref 150–440)
RBC: 5.09 MIL/uL (ref 3.80–5.20)
RDW: 13.8 % (ref 11.5–14.5)
WBC: 6 10*3/uL (ref 3.6–11.0)

## 2016-08-24 LAB — COMPREHENSIVE METABOLIC PANEL
ALK PHOS: 61 U/L (ref 38–126)
ALT: 17 U/L (ref 14–54)
AST: 27 U/L (ref 15–41)
Albumin: 4.3 g/dL (ref 3.5–5.0)
Anion gap: 5 (ref 5–15)
BUN: 25 mg/dL — AB (ref 6–20)
CALCIUM: 9.6 mg/dL (ref 8.9–10.3)
CHLORIDE: 102 mmol/L (ref 101–111)
CO2: 32 mmol/L (ref 22–32)
CREATININE: 1.12 mg/dL — AB (ref 0.44–1.00)
GFR calc Af Amer: >60 mL/min (ref >60)
GFR calc non Af Amer: 55 mL/min — ABNORMAL LOW (ref >60)
Glucose, Bld: 98 mg/dL (ref 65–99)
Potassium: 4.6 mmol/L (ref 3.5–5.1)
Sodium: 139 mmol/L (ref 135–145)
Total Bilirubin: 0.7 mg/dL (ref 0.3–1.2)
Total Protein: 7.9 g/dL (ref 6.5–8.1)

## 2016-08-24 NOTE — Assessment & Plan Note (Addendum)
#  stage 1 right breast ER/PR positive, HER-2 not overexpressed. Clinically no evidence. Finished tamoxifen sep 2017; under active surveillance.  Needs mammogram this month. Last Mammogram July 2017 BIRADS 1.  # Genetic counseling: Maternal aunt had breast cancer at age 55. She was diagnosed at at 29. She has no children but has two sisters with daughters. She is interested in gentetic screening. Will talk with [facility] about testing.   # Dexa Scan: Done in April 2018- T score -2.1. Recommend continuation of calcium and vitamin D. Was taking Fosamax and her hair began falling out. . She is an avid runner and eats healthy.

## 2016-08-24 NOTE — Progress Notes (Signed)
[place] OFFICE PROGRESS NOTE  Patient Care Team: Derinda Late, MD as PCP - General (Family Medicine) Bary Castilla Forest Gleason, MD as Consulting Physician (General Surgery)  Cancer Staging No matching staging information was found for the patient.   Oncology History   carcinoma of breast, diagnosis.  In February of 2011.  Right breast.  ER positive.  PR positive, HER-2/neu, and not overexpressed  Status post 4 cycles of chemotherapy with Cytoxan, and Taxotere Finished chemotherapy on 07/07/2009  Status post bilateral   oopherectomy  and  hysterectomy in February of 2012   Tamoxifen June of 2011     Carcinoma of overlapping 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, estrogen receptor positive ([place])   08/24/2016 Initial Diagnosis    Carcinoma of overlapping 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, estrogen receptor positive ([place])      This is my first interaction with the patient as patient's primary oncologist has been Dr.Choksi. I reviewed the patient's prior charts/pertinent labs/imaging in detail; findings are summarized above.   INTERVAL HISTORY:  Susan Green 56 y.o.  female pleasant patient above history of stage 1 right breast ER/PR positive, HER-2 not overexpressed diagnosed in February 2011.  She is status post right mastectomy reconstructive surgery. She is status post bilateral oopherectomy and hysterectomy in Feb 2012. Finished 6 years of Tamoxifen in September 2017. She denies any new lumps or bumps. No neuropathy. Denies fever chills. Denies headaches. Appetite stable.   REVIEW OF SYSTEMS:  A complete 10 point review of system is done which is negative except mentioned above/history of present illness.   PAST MEDICAL HISTORY :  Past Medical History:  Diagnosis Date  . 174.9 2011   Right breast T1c,N1 (mic),ER 90%; PR 90%, Her 2 neu not over expressed treated with chemotherapy and reconstruction  . Breast cancer ([place]) 01/2009   Right with Chemo  . PONV (postoperative  nausea and vomiting)    very naseated  . Psoriasis   . Thyroid disease    hypothyroidism    PAST SURGICAL HISTORY :   Past Surgical History:  Procedure Laterality Date  . ABDOMINAL HYSTERECTOMY  2011  . APPENDECTOMY  04/29/13  . AUGMENTATION MAMMAPLASTY Bilateral 08/2009   Reconstruction  . BREAST BIOPSY Right 2010   Positive  . BREAST RECONSTRUCTION  2011, 2012   3  . BREAST RECONSTRUCTION Right 2011  . BREAST SURGERY Right 01/25/2009   mastectomy  . BREAST SURGERY Left 2011   made to match the right breast  . cecopexy  04-29-13  . CRANIOTOMY FOR AVM  1998  . DILATION AND CURETTAGE OF UTERUS  2008   polyp  . KNEE ARTHROSCOPY Right 2005  . KNEE ARTHROSCOPY Left 09/22/2014   Procedure: Left knee arthroscopy, partial medial menisectomy;  Surgeon: Dereck Leep, MD;  Location: [facility] ORS;  Service: Orthopedics;  Laterality: Left;  Marland Kitchen MASTECTOMY Right 2011  . PORTACATH PLACEMENT  2011  . TONSILLECTOMY AND ADENOIDECTOMY  1966    FAMILY HISTORY :   Family History  Problem Relation Age of Onset  . Colon cancer Paternal Grandfather 64  . Breast cancer Maternal Aunt   . Cancer - Other Sister 63       V[place]var    SOCIAL HISTORY:   Social History  Substance Use Topics  . Smoking status: Never Smoker  . Smokeless tobacco: Never Used  . Alcohol use No    ALLERGIES:  is allergic to alendronate; codeine; and demerol [meperidine].  MEDICATIONS:  Current Outpatient Prescriptions  Medication Sig Dispense Refill  . b complex vitamins tablet Take 2 tablets by mouth daily.    . Biotin 5000 MCG TABS Take 1 tablet by mouth.    Marland Kitchen CALCIUM-MAGNESIUM PO Take 1 tablet by mouth.    . Calcium-Vitamin D 600-200 MG-UNIT per tablet Take 1 tablet by mouth daily.     . Flaxseed, Linseed, (FLAX SEED OIL) 1300 MG CAPS Take 2 capsules by mouth daily with lunch.    . levothyroxine (SYNTHROID, LEVOTHROID) 100 MCG tablet Take 1 tablet by mouth daily.    . Multiple Minerals-Vitamins (CALCIUM & VIT D3 BONE  HEALTH PO) Take 1,200 mg by mouth daily.    . Multiple Vitamin (MULTIVITAMIN) capsule Take 1 capsule by mouth daily.    . Multiple Vitamins-Minerals (HAIR SKIN AND NAILS FORMULA) TABS Take 1 tablet by mouth.    . vitamin E 400 UNIT capsule Take 400 Units by mouth daily.     No current facility-administered medications for this visit.     PHYSICAL EXAMINATION: ECOG PERFORMANCE STATUS: 0 - Asymptomatic  BP 115/76   Pulse (!) 54   Temp 97.8 F (36.6 C) (Tympanic)   Resp 20   Ht 5' 4" (1.626 m)   Wt 134 lb 8 oz (61 kg)   BMI 23.09 kg/m   Filed Weights   08/24/16 1044  Weight: 134 lb 8 oz (61 kg)    GENERAL: Well-nourished well-developed; Alert, no distress and comfortable sitting in the chair. EYES: no pallor or icterus OROPHARYNX: no thrush or ulceration; good dentition  NECK: supple, no masses felt LYMPH:  no palpable lymphadenopathy in the cervical, axillary or inguinal regions LUNGS: clear to auscultation and  No wheeze or crackles HEART/CVS: regular rate & rhythm and no murmurs; No lower extremity edema ABDOMEN:abdomen soft, non-tender and normal bowel sounds Musculoskeletal:no cyanosis of digits and no clubbing  PSYCH: alert & oriented x 3 with fluent speech NEURO: no focal motor/sensory deficits SKIN:  no rashes or significant lesions. Breast exam performed. No evidence of recurrence. Bilateral breast implants. Scars noted on both breasts.   LABORATORY DATA:  I have reviewed the data as listed    Component Value Date/Time   NA 139 08/24/2016 1001   NA 140 01/27/2014 1538   K 4.6 08/24/2016 1001   K 3.7 01/27/2014 1538   CL 102 08/24/2016 1001   CL 103 01/27/2014 1538   CO2 32 08/24/2016 1001   CO2 34 (H) 01/27/2014 1538   GLUCOSE 98 08/24/2016 1001   GLUCOSE 140 (H) 01/27/2014 1538   BUN 25 (H) 08/24/2016 1001   BUN 18 01/27/2014 1538   CREATININE 1.12 (H) 08/24/2016 1001   CREATININE 1.02 01/27/2014 1538   CALCIUM 9.6 08/24/2016 1001   CALCIUM 8.4 (L)  01/27/2014 1538   PROT 7.9 08/24/2016 1001   PROT 6.7 01/27/2014 1538   ALBUMIN 4.3 08/24/2016 1001   ALBUMIN 3.4 01/27/2014 1538   AST 27 08/24/2016 1001   AST 22 01/27/2014 1538   ALT 17 08/24/2016 1001   ALT 22 01/27/2014 1538   ALKPHOS 61 08/24/2016 1001   ALKPHOS 49 01/27/2014 1538   BILITOT 0.7 08/24/2016 1001   BILITOT 0.3 01/27/2014 1538   GFRNONAA 55 (L) 08/24/2016 1001   GFRNONAA >60 01/27/2014 1538   GFRNONAA >60 05/22/2013 1212   GFRAA >60 08/24/2016 1001   GFRAA >60 01/27/2014 1538   GFRAA >60 05/22/2013 1212    No results found for: SPEP, UPEP  Lab Results  Component Value Date   WBC 6.0 08/24/2016   NEUTROABS 4.1 08/24/2016   HGB 15.6 08/24/2016   HCT 44.9 08/24/2016   MCV 88.3 08/24/2016   PLT 196 08/24/2016      Chemistry      Component Value Date/Time   NA 139 08/24/2016 1001   NA 140 01/27/2014 1538   K 4.6 08/24/2016 1001   K 3.7 01/27/2014 1538   CL 102 08/24/2016 1001   CL 103 01/27/2014 1538   CO2 32 08/24/2016 1001   CO2 34 (H) 01/27/2014 1538   BUN 25 (H) 08/24/2016 1001   BUN 18 01/27/2014 1538   CREATININE 1.12 (H) 08/24/2016 1001   CREATININE 1.02 01/27/2014 1538      Component Value Date/Time   CALCIUM 9.6 08/24/2016 1001   CALCIUM 8.4 (L) 01/27/2014 1538   ALKPHOS 61 08/24/2016 1001   ALKPHOS 49 01/27/2014 1538   AST 27 08/24/2016 1001   AST 22 01/27/2014 1538   ALT 17 08/24/2016 1001   ALT 22 01/27/2014 1538   BILITOT 0.7 08/24/2016 1001   BILITOT 0.3 01/27/2014 1538       RADIOGRAPHIC STUDIES: I have personally reviewed the radiological images as listed and agreed with the findings in the report. No results found.   ASSESSMENT & PLAN:  Carcinoma of overlapping 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, estrogen receptor positive ([place]) # stage 1 right breast ER/PR positive, HER-2 not overexpressed. Clinically no evidence. Finished tamoxifen sep 2017; under active surveillance.  Needs mammogram this month. Last Mammogram July  2017 BIRADS 1.  # Genetic counseling: Maternal aunt had breast cancer at age 53. She was diagnosed at at 66. She has no children but has two sisters with daughters. She is interested in gentetic screening. Will talk with [place] about testing.   # Dexa Scan: Done in April 2018- T score -2.1. Recommend continuation of calcium and vitamin D. Was taking Fosamax and her hair began falling out. . She is an avid runner and eats healthy.   Carcinoma of overlapping 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, estrogen receptor positive ([place]) # stage 1 right breast ER/PR positive, HER-2 not overexpressed. Clinically no evidence. Finished tamoxifen sep 2017; under active surveillance.  Needs mammogram this month. Last Mammogram July 2017 BIRADS 1.  # Genetic counseling: Maternal aunt had breast cancer at age 60. She was diagnosed at at 49. She has no children but has two sisters with daughters. She is interested in gentetic screening. Will talk with [place] about testing.   # Dexa Scan: Done in April 2018- T score -2.1. Recommend continuation of calcium and vitamin D. Was taking Fosamax and her hair began falling out. . She is an avid runner and eats healthy.    Orders Placed This Encounter  Procedures  . MM SCREENING BREAST W/IMPLANT TOMO UNI L    Standing Status:   Future    Standing Expiration Date:   10/25/2017    Order Specific Question:   Reason for Exam (SYMPTOM  OR DIAGNOSIS REQUIRED)    Answer:   Breast Cancer    Order Specific Question:   Is the patient pregnant?    Answer:   No    Order Specific Question:   Preferred imaging location?    Answer:   [facility]  . CBC with Differential    Standing Status:   Future    Standing Expiration Date:   02/24/2018  . Comprehensive metabolic  panel    Standing Status:   Future    Standing Expiration Date:   02/24/2018  . Cancer antigen 27.29    Standing Status:   Future    Standing Expiration Date:   02/24/2018   All questions were answered.  The patient knows to call the clinic with any problems, questions or concerns.      Jacquelin Hawking, NP 08/24/2016 1:19 PM

## 2016-09-05 ENCOUNTER — Ambulatory Visit
Admission: RE | Admit: 2016-09-05 | Discharge: 2016-09-05 | Disposition: A | Discharge status: Home / Self Care | Payer: 59 | Source: Ambulatory Visit | Attending: Oncology | Admitting: Oncology

## 2016-09-05 DIAGNOSIS — Z1231 Encounter for screening mammogram for malignant neoplasm of breast: Secondary | ICD-10-CM | POA: Diagnosis not present

## 2016-09-05 DIAGNOSIS — C50811 Malignant neoplasm of overlapping sites of right female breast: Secondary | ICD-10-CM

## 2016-09-05 DIAGNOSIS — Z170 Estrogen receptor positive status [ER+]: Secondary | ICD-10-CM

## 2016-09-05 HISTORY — DX: Personal history of antineoplastic chemotherapy: Z92.21

## 2017-01-02 DIAGNOSIS — S2231XA Fracture of one rib, right side, initial encounter for closed fracture: Secondary | ICD-10-CM | POA: Diagnosis not present

## 2017-01-02 DIAGNOSIS — R0781 Pleurodynia: Secondary | ICD-10-CM | POA: Diagnosis not present

## 2017-01-21 DIAGNOSIS — E039 Hypothyroidism, unspecified: Secondary | ICD-10-CM | POA: Diagnosis not present

## 2017-01-24 DIAGNOSIS — E039 Hypothyroidism, unspecified: Secondary | ICD-10-CM | POA: Diagnosis not present

## 2017-01-30 DIAGNOSIS — L578 Other skin changes due to chronic exposure to nonionizing radiation: Secondary | ICD-10-CM | POA: Diagnosis not present

## 2017-01-30 DIAGNOSIS — L400 Psoriasis vulgaris: Secondary | ICD-10-CM | POA: Diagnosis not present

## 2017-01-30 DIAGNOSIS — Z79899 Other long term (current) drug therapy: Secondary | ICD-10-CM | POA: Diagnosis not present

## 2017-02-18 DIAGNOSIS — M79672 Pain in left foot: Secondary | ICD-10-CM | POA: Diagnosis not present

## 2017-05-31 DIAGNOSIS — Z79899 Other long term (current) drug therapy: Secondary | ICD-10-CM | POA: Diagnosis not present

## 2017-06-04 ENCOUNTER — Other Ambulatory Visit (HOSPITAL_COMMUNITY): Payer: Self-pay | Admitting: Family Medicine

## 2017-06-04 DIAGNOSIS — Z853 Personal history of malignant neoplasm of breast: Secondary | ICD-10-CM

## 2017-06-04 DIAGNOSIS — Z0000 Encounter for general adult medical examination without abnormal findings: Secondary | ICD-10-CM | POA: Diagnosis not present

## 2017-06-04 DIAGNOSIS — E7800 Pure hypercholesterolemia, unspecified: Secondary | ICD-10-CM | POA: Diagnosis not present

## 2017-06-04 DIAGNOSIS — E039 Hypothyroidism, unspecified: Secondary | ICD-10-CM | POA: Diagnosis not present

## 2017-06-04 DIAGNOSIS — Z9882 Breast implant status: Secondary | ICD-10-CM

## 2017-06-10 ENCOUNTER — Other Ambulatory Visit (HOSPITAL_COMMUNITY): Payer: 59

## 2017-06-13 ENCOUNTER — Ambulatory Visit (HOSPITAL_COMMUNITY)
Admission: RE | Admit: 2017-06-13 | Discharge: 2017-06-13 | Disposition: A | Discharge status: Home / Self Care | Payer: 59 | Source: Ambulatory Visit | Attending: Family Medicine | Admitting: Family Medicine

## 2017-06-13 DIAGNOSIS — Z853 Personal history of malignant neoplasm of breast: Secondary | ICD-10-CM | POA: Diagnosis not present

## 2017-06-13 DIAGNOSIS — N6489 Other specified disorders of breast: Secondary | ICD-10-CM | POA: Diagnosis not present

## 2017-06-13 DIAGNOSIS — Z9882 Breast implant status: Secondary | ICD-10-CM | POA: Insufficient documentation

## 2017-06-13 MED ORDER — GADOBENATE DIMEGLUMINE 529 MG/ML IV SOLN
15.0000 mL | Freq: Once | INTRAVENOUS | Status: AC | PRN
  Administered 2017-06-13: 12 mL via INTRAVENOUS

## 2017-08-28 ENCOUNTER — Ambulatory Visit: Payer: 59 | Admitting: Internal Medicine

## 2017-08-28 ENCOUNTER — Other Ambulatory Visit: Payer: 59

## 2017-09-04 ENCOUNTER — Inpatient Hospital Stay: Payer: 59 | Attending: Internal Medicine | Admitting: Internal Medicine

## 2017-09-04 ENCOUNTER — Inpatient Hospital Stay: Payer: 59

## 2017-09-04 ENCOUNTER — Other Ambulatory Visit: Payer: Self-pay | Admitting: Physician Assistant

## 2017-09-04 VITALS — BP 129/84 | HR 58 | Resp 16 | Wt 131.0 lb

## 2017-09-04 DIAGNOSIS — C50811 Malignant neoplasm of overlapping sites of right female breast: Secondary | ICD-10-CM | POA: Diagnosis not present

## 2017-09-04 DIAGNOSIS — Z170 Estrogen receptor positive status [ER+]: Principal | ICD-10-CM

## 2017-09-04 LAB — COMPREHENSIVE METABOLIC PANEL
ALBUMIN: 4.1 g/dL (ref 3.5–5.0)
ALT: 22 U/L (ref 0–44)
AST: 28 U/L (ref 15–41)
Alkaline Phosphatase: 56 U/L (ref 38–126)
Anion gap: 8 (ref 5–15)
BILIRUBIN TOTAL: 0.8 mg/dL (ref 0.3–1.2)
BUN: 30 mg/dL — AB (ref 6–20)
CALCIUM: 9.3 mg/dL (ref 8.9–10.3)
CO2: 26 mmol/L (ref 22–32)
CREATININE: 1.01 mg/dL — AB (ref 0.44–1.00)
Chloride: 105 mmol/L (ref 98–111)
GFR calc Af Amer: >60 mL/min (ref >60)
GFR calc non Af Amer: >60 mL/min (ref >60)
GLUCOSE: 106 mg/dL — AB (ref 70–99)
Potassium: 3.9 mmol/L (ref 3.5–5.1)
SODIUM: 139 mmol/L (ref 135–145)
TOTAL PROTEIN: 7.4 g/dL (ref 6.5–8.1)

## 2017-09-04 LAB — CBC WITH DIFFERENTIAL/PLATELET
BASOS ABS: 0 10*3/uL (ref 0–0.1)
BASOS PCT: 1 %
EOS ABS: 0.2 10*3/uL (ref 0–0.7)
EOS PCT: 3 %
HCT: 43.2 % (ref 35.0–47.0)
Hemoglobin: 14.7 g/dL (ref 12.0–16.0)
Lymphocytes Relative: 15 %
Lymphs Abs: 0.8 10*3/uL — ABNORMAL LOW (ref 1.0–3.6)
MCH: 30.8 pg (ref 26.0–34.0)
MCHC: 34 g/dL (ref 32.0–36.0)
MCV: 90.4 fL (ref 80.0–100.0)
MONO ABS: 0.4 10*3/uL (ref 0.2–0.9)
MONOS PCT: 8 %
Neutro Abs: 3.8 10*3/uL (ref 1.4–6.5)
Neutrophils Relative %: 73 %
PLATELETS: 173 10*3/uL (ref 150–440)
RBC: 4.78 MIL/uL (ref 3.80–5.20)
RDW: 13.9 % (ref 11.5–14.5)
WBC: 5.2 10*3/uL (ref 3.6–11.0)

## 2017-09-04 NOTE — Assessment & Plan Note (Addendum)
#  stage 1 right breast ER/PR positive, HER-2 not overexpressed. Clinically no evidence. Finished tamoxifen sep 2017; under active surveillance.  Needs mammogram/unilateral left-this month..  # Genetic counseling: Maternal aunt had breast cancer at age 56. She was diagnosed at at 47. She has no children but has two sisters with daughters. Declined sec to life insuarnce reasons [no children]; she had discussed with sisters.   # Dexa Scan: Done in April 2018- T score -2.1. Continue calcium and vitamin D.  Recommend follow-up with PCP regarding next DEXA scan.  # follow up in 12 months/albs- ca-27-29.  Will call regarding CA 29. 

## 2017-09-04 NOTE — Progress Notes (Signed)
OFFICE PROGRESS NOTE  Patient Care Team: Derinda Late, MD as PCP - General (Family Medicine) Bary Castilla Forest Gleason, MD as Consulting Physician (General Surgery)  Cancer Staging No matching staging information was found for the patient.   Oncology History   carcinoma of breast, diagnosis.  In February of 2011.  Right breast.  ER positive.  PR positive, HER-2/neu, and not overexpressed s/p Right mastec  Status post 4 cycles of chemotherapy with Cytoxan, and Taxotere Finished chemotherapy on 07/07/2009  Status post bilateral   oopherectomy  and  hysterectomy in February of 2012   Tamoxifen June of 2011   # Genetcis- delcines     Carcinoma of overlapping 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, estrogen receptor positive ([place])   08/24/2016 Initial Diagnosis    Carcinoma of overlapping 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, estrogen receptor positive ([place])         INTERVAL HISTORY:  Susan Green 57 y.o.  female pleasant patient above history of breast cancer is here for follow-up.   Patient denies any new lumps or bumps.  Appetite good.  Continues to be very active.   Review of Systems  Constitutional: Negative for chills, diaphoresis, fever, malaise/fatigue and weight loss.  HENT: Negative for nosebleeds and sore throat.   Eyes: Negative for double vision.  Respiratory: Negative for cough, hemoptysis, sputum production, shortness of breath and wheezing.   Cardiovascular: Negative for chest pain, palpitations, orthopnea and leg swelling.  Gastrointestinal: Negative for abdominal pain, blood in stool, constipation, diarrhea, heartburn, melena, nausea and vomiting.  Genitourinary: Negative for dysuria, frequency and urgency.  Musculoskeletal: Negative for back pain and joint pain.  Skin: Negative.  Negative for itching and rash.  Neurological: Negative for dizziness, tingling, focal weakness, weakness and headaches.  Endo/Heme/Allergies: Does not bruise/bleed easily.   Psychiatric/Behavioral: Negative for depression. The patient is not nervous/anxious and does not have insomnia.       PAST MEDICAL HISTORY :  Past Medical History:  Diagnosis Date  . 174.9 2011   Right breast T1c,N1 (mic),ER 90%; PR 90%, Her 2 neu not over expressed treated with chemotherapy and reconstruction  . Breast cancer ([place]) 01/2009   Right with Chemo  . Personal history of chemotherapy 2010   BREAST CA  . PONV (postoperative nausea and vomiting)    very naseated  . Psoriasis   . Thyroid disease    hypothyroidism    PAST SURGICAL HISTORY :   Past Surgical History:  Procedure Laterality Date  . ABDOMINAL HYSTERECTOMY  2011  . APPENDECTOMY  04/29/13  . AUGMENTATION MAMMAPLASTY Bilateral 08/2009   Reconstruction  . BREAST BIOPSY Right 2010   Positive  . BREAST RECONSTRUCTION  2011, 2012   3  . BREAST RECONSTRUCTION Right 2011  . BREAST SURGERY Right 01/25/2009   mastectomy  . BREAST SURGERY Left 2011   made to match the right breast  . cecopexy  04-29-13  . CRANIOTOMY FOR AVM  1998  . DILATION AND CURETTAGE OF UTERUS  2008   polyp  . KNEE ARTHROSCOPY Right 2005  . KNEE ARTHROSCOPY Left 09/22/2014   Procedure: Left knee arthroscopy, partial medial menisectomy;  Surgeon: Dereck Leep, MD;  Location: [facility] ORS;  Service: Orthopedics;  Laterality: Left;  Marland Kitchen MASTECTOMY Right 2010   BREAST CA  . PORTACATH PLACEMENT  2011  . TONSILLECTOMY AND ADENOIDECTOMY  1966    FAMILY HISTORY :   Family History  Problem Relation Age of Onset  .  Colon cancer Paternal Grandfather 53  . Breast cancer Maternal Aunt   . Cancer - Other Sister 29       [place]    SOCIAL HISTORY:   Social History   Tobacco Use  . Smoking status: Never Smoker  . Smokeless tobacco: Never Used  Substance Use Topics  . Alcohol use: No  . Drug use: No    ALLERGIES:  is allergic to alendronate; codeine; and demerol [meperidine].  MEDICATIONS:  Current Outpatient Medications  Medication Sig  Dispense Refill  . b complex vitamins tablet Take 2 tablets by mouth daily.    . Biotin 5000 MCG TABS Take 1 tablet by mouth.    Marland Kitchen CALCIUM-MAGNESIUM PO Take 1 tablet by mouth.    . Calcium-Vitamin D 600-200 MG-UNIT per tablet Take 1 tablet by mouth daily.     . Flaxseed, Linseed, (FLAX SEED OIL) 1300 MG CAPS Take 2 capsules by mouth daily with lunch.    . levothyroxine (SYNTHROID, LEVOTHROID) 100 MCG tablet Take 1 tablet by mouth daily.    . Multiple Minerals-Vitamins (CALCIUM & VIT D3 BONE HEALTH PO) Take 1,200 mg by mouth daily.    . Multiple Vitamin (MULTIVITAMIN) capsule Take 1 capsule by mouth daily.    . Multiple Vitamins-Minerals (HAIR SKIN AND NAILS FORMULA) TABS Take 1 tablet by mouth.    . vitamin E 400 UNIT capsule Take 400 Units by mouth daily.     No current facility-administered medications for this visit.     PHYSICAL EXAMINATION: ECOG PERFORMANCE STATUS: 0 - Asymptomatic  BP 129/84 (BP Location: Left Arm, Patient Position: Sitting)   Pulse (!) 58   Resp 16   Wt 131 lb (59.4 kg)   BMI 22.49 kg/m   Filed Weights   09/04/17 1059  Weight: 131 lb (59.4 kg)    GENERAL: Well-nourished well-developed; Alert, no distress and comfortable.  Alone. EYES: no pallor or icterus OROPHARYNX: no thrush or ulceration; NECK: supple; no lymph nodes felt. LYMPH:  no palpable lymphadenopathy in the axillary or inguinal regions LUNGS: Decreased breath sounds auscultation bilaterally. No wheeze or crackles HEART/CVS: regular rate & rhythm and no murmurs; No lower extremity edema ABDOMEN:abdomen soft, non-tender and normal bowel sounds. No hepatomegaly or splenomegaly.  Musculoskeletal:no cyanosis of digits and no clubbing  PSYCH: alert & oriented x 3 with fluent speech NEURO: no focal motor/sensory deficits SKIN:  no rashes or significant lesions Right and left BREAST exam [in the presence of nurse]-right breast mastectomy ; implant-no lumps or bumps.  Left breast no unusual skin  changes or dominant masses felt-implant in place    LABORATORY DATA:  I have reviewed the data as listed    Component Value Date/Time   NA 139 09/04/2017 1023   NA 140 01/27/2014 1538   K 3.9 09/04/2017 1023   K 3.7 01/27/2014 1538   CL 105 09/04/2017 1023   CL 103 01/27/2014 1538   CO2 26 09/04/2017 1023   CO2 34 (H) 01/27/2014 1538   GLUCOSE 106 (H) 09/04/2017 1023   GLUCOSE 140 (H) 01/27/2014 1538   BUN 30 (H) 09/04/2017 1023   BUN 18 01/27/2014 1538   CREATININE 1.01 (H) 09/04/2017 1023   CREATININE 1.02 01/27/2014 1538   CALCIUM 9.3 09/04/2017 1023   CALCIUM 8.4 (L) 01/27/2014 1538   PROT 7.4 09/04/2017 1023   PROT 6.7 01/27/2014 1538   ALBUMIN 4.1 09/04/2017 1023   ALBUMIN 3.4 01/27/2014 1538   AST 28 09/04/2017 1023   AST  22 01/27/2014 1538   ALT 22 09/04/2017 1023   ALT 22 01/27/2014 1538   ALKPHOS 56 09/04/2017 1023   ALKPHOS 49 01/27/2014 1538   BILITOT 0.8 09/04/2017 1023   BILITOT 0.3 01/27/2014 1538   GFRNONAA >60 09/04/2017 1023   GFRNONAA >60 01/27/2014 1538   GFRNONAA >60 05/22/2013 1212   GFRAA >60 09/04/2017 1023   GFRAA >60 01/27/2014 1538   GFRAA >60 05/22/2013 1212    No results found for: SPEP, UPEP  Lab Results  Component Value Date   WBC 5.2 09/04/2017   NEUTROABS 3.8 09/04/2017   HGB 14.7 09/04/2017   HCT 43.2 09/04/2017   MCV 90.4 09/04/2017   PLT 173 09/04/2017      Chemistry      Component Value Date/Time   NA 139 09/04/2017 1023   NA 140 01/27/2014 1538   K 3.9 09/04/2017 1023   K 3.7 01/27/2014 1538   CL 105 09/04/2017 1023   CL 103 01/27/2014 1538   CO2 26 09/04/2017 1023   CO2 34 (H) 01/27/2014 1538   BUN 30 (H) 09/04/2017 1023   BUN 18 01/27/2014 1538   CREATININE 1.01 (H) 09/04/2017 1023   CREATININE 1.02 01/27/2014 1538      Component Value Date/Time   CALCIUM 9.3 09/04/2017 1023   CALCIUM 8.4 (L) 01/27/2014 1538   ALKPHOS 56 09/04/2017 1023   ALKPHOS 49 01/27/2014 1538   AST 28 09/04/2017 1023   AST 22  01/27/2014 1538   ALT 22 09/04/2017 1023   ALT 22 01/27/2014 1538   BILITOT 0.8 09/04/2017 1023   BILITOT 0.3 01/27/2014 1538       RADIOGRAPHIC STUDIES: I have personally reviewed the radiological images as listed and agreed with the findings in the report. No results found.   ASSESSMENT & PLAN:  Carcinoma of overlapping 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, estrogen receptor positive ([place]) # stage 1 right breast ER/PR positive, HER-2 not overexpressed. Clinically no evidence. Finished tamoxifen sep 2017; under active surveillance.  Needs mammogram/unilateral left-this month..  # Genetic counseling: Maternal aunt had breast cancer at age 40. She was diagnosed at at 69. She has no children but has two sisters with daughters. Declined sec to life insuarnce reasons [no children]; she had discussed with sisters.   # Dexa Scan: Done in April 2018- T score -2.1. Continue calcium and vitamin D.  Recommend follow-up with PCP regarding next DEXA scan.  # follow up in 12 months/albs- ca-27-29.  Will call regarding CA 29.   Orders Placed This Encounter  Procedures  . US Breast Limited Uni Left Inc Axilla    Standing Status:   Future    Standing Expiration Date:   11/06/2018    Order Specific Question:   Reason for Exam (SYMPTOM  OR DIAGNOSIS REQUIRED)    Answer:   left breast discomfort; axillary lymph node swelling; h/o right breast cancer    Order Specific Question:   Preferred imaging location?    Answer:   [facility]  . MM DIAG BREAST W/IMPLANT TOMO UNI L    Standing Status:   Future    Standing Expiration Date:   11/06/2018    Order Specific Question:   Reason for Exam (SYMPTOM  OR DIAGNOSIS REQUIRED)    Answer:   left breast discomfort; left axilary lymph node swelling; h/o of right breast cancer    Order Specific Question:   Is the patient pregnant?    Answer:   No  Order Specific Question:   Preferred imaging location?    Answer:   [facility]  . CBC with Differential     Standing Status:   Future    Standing Expiration Date:   09/05/2018  . Comprehensive metabolic panel    Standing Status:   Future    Standing Expiration Date:   09/05/2018  . Cancer antigen 27.29    Standing Status:   Future    Standing Expiration Date:   09/05/2018   All questions were answered. The patient knows to call the clinic with any problems, questions or concerns.      Cammie Sickle, MD 09/04/2017 12:29 PM

## 2017-09-05 LAB — CANCER ANTIGEN 27.29: CA 27.29: 16.9 U/mL (ref 0.0–38.6)

## 2017-09-09 ENCOUNTER — Other Ambulatory Visit: Payer: 59

## 2017-09-12 ENCOUNTER — Ambulatory Visit
Admission: RE | Admit: 2017-09-12 | Discharge: 2017-09-12 | Disposition: A | Discharge status: Home / Self Care | Payer: 59 | Source: Ambulatory Visit | Attending: Internal Medicine | Admitting: Internal Medicine

## 2017-09-12 DIAGNOSIS — R922 Inconclusive mammogram: Secondary | ICD-10-CM | POA: Diagnosis not present

## 2017-09-12 DIAGNOSIS — C50811 Malignant neoplasm of overlapping sites of right female breast: Secondary | ICD-10-CM | POA: Diagnosis present

## 2017-09-12 DIAGNOSIS — Z170 Estrogen receptor positive status [ER+]: Secondary | ICD-10-CM | POA: Insufficient documentation

## 2017-09-12 DIAGNOSIS — N6489 Other specified disorders of breast: Secondary | ICD-10-CM | POA: Diagnosis not present

## 2017-11-05 DIAGNOSIS — E039 Hypothyroidism, unspecified: Secondary | ICD-10-CM | POA: Diagnosis not present

## 2017-11-08 DIAGNOSIS — Z853 Personal history of malignant neoplasm of breast: Secondary | ICD-10-CM | POA: Diagnosis not present

## 2017-11-08 DIAGNOSIS — E039 Hypothyroidism, unspecified: Secondary | ICD-10-CM | POA: Diagnosis not present

## 2017-11-18 ENCOUNTER — Ambulatory Visit: Payer: 59 | Admitting: Obstetrics and Gynecology

## 2017-12-06 DIAGNOSIS — E7800 Pure hypercholesterolemia, unspecified: Secondary | ICD-10-CM | POA: Diagnosis not present

## 2017-12-06 DIAGNOSIS — Z79899 Other long term (current) drug therapy: Secondary | ICD-10-CM | POA: Diagnosis not present

## 2017-12-10 DIAGNOSIS — E039 Hypothyroidism, unspecified: Secondary | ICD-10-CM | POA: Diagnosis not present

## 2017-12-10 DIAGNOSIS — L409 Psoriasis, unspecified: Secondary | ICD-10-CM | POA: Diagnosis not present

## 2017-12-10 DIAGNOSIS — E7800 Pure hypercholesterolemia, unspecified: Secondary | ICD-10-CM | POA: Diagnosis not present

## 2018-01-29 DIAGNOSIS — Z1283 Encounter for screening for malignant neoplasm of skin: Secondary | ICD-10-CM | POA: Diagnosis not present

## 2018-01-29 DIAGNOSIS — L400 Psoriasis vulgaris: Secondary | ICD-10-CM | POA: Diagnosis not present

## 2018-01-29 DIAGNOSIS — L578 Other skin changes due to chronic exposure to nonionizing radiation: Secondary | ICD-10-CM | POA: Diagnosis not present

## 2018-01-31 DIAGNOSIS — R000 Tachycardia, unspecified: Secondary | ICD-10-CM | POA: Diagnosis not present

## 2018-01-31 DIAGNOSIS — E7800 Pure hypercholesterolemia, unspecified: Secondary | ICD-10-CM | POA: Diagnosis not present

## 2018-01-31 DIAGNOSIS — R002 Palpitations: Secondary | ICD-10-CM | POA: Diagnosis not present

## 2018-04-11 DIAGNOSIS — H5203 Hypermetropia, bilateral: Secondary | ICD-10-CM | POA: Diagnosis not present

## 2018-04-11 DIAGNOSIS — H2513 Age-related nuclear cataract, bilateral: Secondary | ICD-10-CM | POA: Diagnosis not present

## 2018-04-11 DIAGNOSIS — H52203 Unspecified astigmatism, bilateral: Secondary | ICD-10-CM | POA: Diagnosis not present

## 2018-05-30 ENCOUNTER — Encounter: Payer: Self-pay | Admitting: Emergency Medicine

## 2018-05-30 ENCOUNTER — Ambulatory Visit
Admission: EM | Admit: 2018-05-30 | Discharge: 2018-05-30 | Disposition: A | Discharge status: Home / Self Care | Payer: 59 | Attending: Family Medicine | Admitting: Family Medicine

## 2018-05-30 ENCOUNTER — Other Ambulatory Visit: Payer: Self-pay

## 2018-05-30 ENCOUNTER — Ambulatory Visit (INDEPENDENT_AMBULATORY_CARE_PROVIDER_SITE_OTHER): Payer: 59

## 2018-05-30 DIAGNOSIS — M7750 Other enthesopathy of unspecified foot: Secondary | ICD-10-CM | POA: Diagnosis not present

## 2018-05-30 DIAGNOSIS — M79672 Pain in left foot: Secondary | ICD-10-CM | POA: Diagnosis not present

## 2018-05-30 NOTE — ED Provider Notes (Signed)
[facility]    CSN: [id number] Arrival date & time: 05/30/18  1431     History   Chief Complaint Chief Complaint  Patient presents with  . Foot Pain    left    HPI Susan Green is a 58 y.o. female.   58 yo female, runner, with a c/o left foot pain to the top of the foot. Denies any falls or other traumatic injury.   The history is provided by the patient.  Foot Pain  This is a new problem. The current episode started yesterday. The problem occurs constantly. The problem has not changed since onset.Pertinent negatives include no chest pain, no abdominal pain, no headaches and no shortness of breath.    Past Medical History:  Diagnosis Date  . Cancer ([place])     There are no active problems to display for this patient.   Past Surgical History:  Procedure Laterality Date  . ABDOMINAL HYSTERECTOMY    . APPENDECTOMY    . BREAST SURGERY    . CRANIOTOMY FOR AVM    . KNEE ARTHROSCOPY Bilateral     OB History   No obstetric history on file.      Home Medications    Prior to Admission medications   Medication Sig Start Date End Date Taking? Authorizing Provider  Apremilast (OTEZLA) 30 MG TABS Take by mouth.   Yes [provider]  levothyroxine (SYNTHROID, LEVOTHROID) 100 MCG tablet TK 1 T PO D 05/23/18  Yes [provider]    Family History Family History  Problem Relation Age of Onset  . Healthy Mother   . Healthy Father     Social History Social History   Tobacco Use  . Smoking status: Never Smoker  . Smokeless tobacco: Never Used  Substance Use Topics  . Alcohol use: Never    Frequency: Never  . Drug use: Never     Allergies   Codeine and Demerol [meperidine]   Review of Systems Review of Systems  Respiratory: Negative for shortness of breath.   Cardiovascular: Negative for chest pain.  Gastrointestinal: Negative for abdominal pain.  Neurological: Negative for headaches.     Physical Exam Triage Vital Signs  ED Triage Vitals  Enc Vitals Group     BP 05/30/18 1500 113/73     Pulse Rate 05/30/18 1500 62     Resp 05/30/18 1500 16     Temp 05/30/18 1500 98 F (36.7 C)     Temp Source 05/30/18 1500 Oral     SpO2 05/30/18 1500 99 %     Weight 05/30/18 1453 129 lb (58.5 kg)     Height 05/30/18 1453 5\' 4"  (1.626 m)     Head Circumference --      Peak Flow --      Pain Score 05/30/18 1453 8     Pain Loc --      Pain Edu? --      Excl. in [place]? --    No data found.  Updated Vital Signs BP 113/73 (BP Location: Left Arm)   Pulse 62   Temp 98 F (36.7 C) (Oral)   Resp 16   Ht 5\' 4"  (1.626 m)   Wt 58.5 kg   SpO2 99%   BMI 22.14 kg/m   Visual Acuity Right Eye Distance:   Left Eye Distance:   Bilateral Distance:    Right Eye Near:   Left Eye Near:    Bilateral Near:     Physical  Exam Vitals signs and nursing note reviewed.  Constitutional:      General: She is not in acute distress.    Appearance: She is not toxic-appearing or diaphoretic.  Musculoskeletal:     Left foot: Normal range of motion and normal capillary refill. Tenderness, bony tenderness (top of mid foot) and swelling (mild, minimal) present. No crepitus, deformity or laceration.  Neurological:     Mental Status: She is alert.      UC Treatments / Results  Labs (all labs ordered are listed, but only abnormal results are displayed) Labs Reviewed - No data to display  EKG None  Radiology Dg Foot Complete Left  Result Date: 05/30/2018 CLINICAL DATA:  Right foot pain, runner EXAM: LEFT FOOT - COMPLETE 3+ VIEW COMPARISON:  None. FINDINGS: There is no evidence of fracture or dislocation. There is no evidence of arthropathy or other focal bone abnormality. Soft tissues are unremarkable. IMPRESSION: Negative. Electronically Signed   By: Rolm Baptise M.D.   On: 05/30/2018 15:38    Procedures Procedures (including critical care time)  Medications Ordered in UC Medications - No data to display  Initial  Impression / Assessment and Plan / UC Course  I have reviewed the triage vital signs and the nursing notes.  Pertinent labs & imaging results that were available during my care of the patient were reviewed by me and considered in my medical decision making (see chart for details).      Final Clinical Impressions(s) / UC Diagnoses   Final diagnoses:  Tendonitis of foot     Discharge Instructions     Rest, ice, elevation, over the counter anti-inflammatories    ED Prescriptions    None     1. x-ray results (negative) and diagnosis reviewed with patient 2. Recommend supportive treatment as above 3. Follow-up prn if symptoms worsen or don't improve   Controlled Substance Prescriptions [place] Controlled Substance Registry consulted? Not Applicable   Norval Gable, MD 05/30/18 [phone]

## 2018-05-30 NOTE — ED Triage Notes (Signed)
Pt c/o left foot pain. She reports that she has not injured but she is runner and has had tendonitis in this foot in the past. Started last night but worse this morning.

## 2018-05-30 NOTE — Discharge Instructions (Signed)
Rest, ice, elevation, over the counter anti-inflammatories

## 2018-09-05 ENCOUNTER — Other Ambulatory Visit: Payer: 59

## 2018-09-05 ENCOUNTER — Ambulatory Visit: Payer: 59 | Admitting: Internal Medicine

## 2018-09-18 ENCOUNTER — Other Ambulatory Visit: Payer: Self-pay

## 2018-09-18 DIAGNOSIS — C50811 Malignant neoplasm of overlapping sites of right female breast: Secondary | ICD-10-CM

## 2018-09-19 ENCOUNTER — Other Ambulatory Visit: Payer: Self-pay | Admitting: *Deleted

## 2018-09-19 ENCOUNTER — Inpatient Hospital Stay: Payer: 59 | Attending: Internal Medicine

## 2018-09-19 ENCOUNTER — Encounter: Payer: Self-pay | Admitting: Internal Medicine

## 2018-09-19 ENCOUNTER — Other Ambulatory Visit: Payer: Self-pay

## 2018-09-19 ENCOUNTER — Inpatient Hospital Stay (HOSPITAL_BASED_OUTPATIENT_CLINIC_OR_DEPARTMENT_OTHER): Payer: 59 | Admitting: Internal Medicine

## 2018-09-19 DIAGNOSIS — Z9011 Acquired absence of right breast and nipple: Secondary | ICD-10-CM | POA: Diagnosis not present

## 2018-09-19 DIAGNOSIS — R718 Other abnormality of red blood cells: Secondary | ICD-10-CM

## 2018-09-19 DIAGNOSIS — Z170 Estrogen receptor positive status [ER+]: Secondary | ICD-10-CM | POA: Insufficient documentation

## 2018-09-19 DIAGNOSIS — C50811 Malignant neoplasm of overlapping sites of right female breast: Secondary | ICD-10-CM | POA: Diagnosis present

## 2018-09-19 LAB — COMPREHENSIVE METABOLIC PANEL
ALT: 22 U/L (ref 0–44)
AST: 26 U/L (ref 15–41)
Albumin: 4.2 g/dL (ref 3.5–5.0)
Alkaline Phosphatase: 52 U/L (ref 38–126)
Anion gap: 9 (ref 5–15)
BUN: 30 mg/dL — ABNORMAL HIGH (ref 6–20)
CO2: 27 mmol/L (ref 22–32)
Calcium: 9.3 mg/dL (ref 8.9–10.3)
Chloride: 104 mmol/L (ref 98–111)
Creatinine, Ser: 0.99 mg/dL (ref 0.44–1.00)
GFR calc Af Amer: >60 mL/min (ref >60)
GFR calc non Af Amer: >60 mL/min (ref >60)
Glucose, Bld: 104 mg/dL — ABNORMAL HIGH (ref 70–99)
Potassium: 3.9 mmol/L (ref 3.5–5.1)
Sodium: 140 mmol/L (ref 135–145)
Total Bilirubin: 0.6 mg/dL (ref 0.3–1.2)
Total Protein: 7 g/dL (ref 6.5–8.1)

## 2018-09-19 LAB — CBC WITH DIFFERENTIAL/PLATELET
Abs Immature Granulocytes: 0.01 10*3/uL (ref 0.00–0.07)
Basophils Absolute: 0 10*3/uL (ref 0.0–0.1)
Basophils Relative: 1 %
Eosinophils Absolute: 0.2 10*3/uL (ref 0.0–0.5)
Eosinophils Relative: 4 %
HCT: 46.1 % — ABNORMAL HIGH (ref 36.0–46.0)
Hemoglobin: 15.1 g/dL — ABNORMAL HIGH (ref 12.0–15.0)
Immature Granulocytes: 0 %
Lymphocytes Relative: 19 %
Lymphs Abs: 1 10*3/uL (ref 0.7–4.0)
MCH: 29.8 pg (ref 26.0–34.0)
MCHC: 32.8 g/dL (ref 30.0–36.0)
MCV: 90.9 fL (ref 80.0–100.0)
Monocytes Absolute: 0.4 10*3/uL (ref 0.1–1.0)
Monocytes Relative: 8 %
Neutro Abs: 3.6 10*3/uL (ref 1.7–7.7)
Neutrophils Relative %: 68 %
Platelets: 190 10*3/uL (ref 150–400)
RBC: 5.07 MIL/uL (ref 3.87–5.11)
RDW: 13.2 % (ref 11.5–15.5)
WBC: 5.1 10*3/uL (ref 4.0–10.5)
nRBC: 0 % (ref 0.0–0.2)

## 2018-09-19 NOTE — Progress Notes (Signed)
[facility] OFFICE PROGRESS NOTE  Patient Care Team: Derinda Late, MD as PCP - General (Family Medicine) Bary Castilla, Forest Gleason, MD as Consulting Physician (General Surgery) Derinda Late, MD (Family Medicine)  Cancer Staging No matching staging information was found for the patient.   Oncology History Overview Note  carcinoma of breast, diagnosis.  In February of 2011.  Right breast.  ER positive.  PR positive, HER-2/neu, and not overexpressed s/p Right mastec  Status post 4 cycles of chemotherapy with Cytoxan, and Taxotere Finished chemotherapy on 07/07/2009  Status post bilateral   oopherectomy  and  hysterectomy in February of 2012   Tamoxifen June of 2011   # Genetcis- delcines  # Osteopenia- hair loss sec to Foaomax   Carcinoma of overlapping 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, estrogen receptor positive ([place])  08/24/2016 Initial Diagnosis   Carcinoma of overlapping 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, estrogen receptor positive ([place])       INTERVAL HISTORY:  Susan Green 58 y.o.  female pleasant patient above history of breast cancer is here for follow-up.   Patient denies any new lumps or bumps.  Appetite is good.  No weight loss.  She continues to be very active/physically.  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   Review of Systems  Constitutional: Negative for chills, diaphoresis, fever, malaise/fatigue and weight loss.  HENT: Negative for nosebleeds and sore throat.   Eyes: Negative for double vision.  Respiratory: Negative for cough, hemoptysis, sputum production, shortness of breath and wheezing.   Cardiovascular: Negative for chest pain, palpitations, orthopnea and leg swelling.  Gastrointestinal: Negative for abdominal pain, blood in stool, constipation, diarrhea, heartburn, melena, nausea and vomiting.  Genitourinary: Negative for dysuria, frequency and urgency.  Musculoskeletal: Negative for back pain and joint pain.  Skin:  Negative.  Negative for itching and rash.  Neurological: Negative for dizziness, tingling, focal weakness, weakness and headaches.  Endo/Heme/Allergies: Does not bruise/bleed easily.  Psychiatric/Behavioral: Negative for depression. The patient is not nervous/anxious and does not have insomnia.       PAST MEDICAL HISTORY :  Past Medical History:  Diagnosis Date  . 174.9 2011   Right breast T1c,N1 (mic),ER 90%; PR 90%, Her 2 neu not over expressed treated with chemotherapy and reconstruction  . Breast cancer ([place]) 01/2009   Right with Chemo  . Cancer ([place])   . Personal history of chemotherapy 2010   BREAST CA  . PONV (postoperative nausea and vomiting)    very naseated  . Psoriasis   . Thyroid disease    hypothyroidism    PAST SURGICAL HISTORY :   Past Surgical History:  Procedure Laterality Date  . ABDOMINAL HYSTERECTOMY  2011  . ABDOMINAL HYSTERECTOMY    . APPENDECTOMY  04/29/13  . APPENDECTOMY    . AUGMENTATION MAMMAPLASTY Bilateral 08/2009   Reconstruction  . BREAST BIOPSY Right 2010   Positive  . BREAST RECONSTRUCTION  2011, 2012   3  . BREAST RECONSTRUCTION Right 2011  . BREAST SURGERY Right 01/25/2009   mastectomy  . BREAST SURGERY Left 2011   made to match the right breast  . BREAST SURGERY    . cecopexy  04-29-13  . CRANIOTOMY FOR AVM  1998  . CRANIOTOMY FOR AVM    . DILATION AND CURETTAGE OF UTERUS  2008   polyp  . KNEE ARTHROSCOPY Right 2005  . KNEE ARTHROSCOPY Left 09/22/2014   Procedure: Left knee arthroscopy, partial medial menisectomy;  Surgeon: Dereck Leep, MD;  Location: [facility] ORS;  Service: Orthopedics;  Laterality: Left;  . KNEE ARTHROSCOPY Bilateral   . MASTECTOMY Right 2010   BREAST CA  . PORTACATH PLACEMENT  2011  . TONSILLECTOMY AND ADENOIDECTOMY  1966    FAMILY HISTORY :   Family History  Problem Relation Age of Onset  . Healthy Mother   . Healthy Father   . Colon cancer Paternal Grandfather 80  . Breast cancer Maternal Aunt   .  Cancer - Other Sister 21       [place]    SOCIAL HISTORY:   Social History   Tobacco Use  . Smoking status: Never Smoker  . Smokeless tobacco: Never Used  Substance Use Topics  . Alcohol use: Never    Frequency: Never  . Drug use: Never    ALLERGIES:  is allergic to alendronate; codeine; codeine; demerol [meperidine]; and demerol [meperidine].  MEDICATIONS:  Current Outpatient Medications  Medication Sig Dispense Refill  . Apremilast (OTEZLA) 30 MG TABS Take 1 tablet by mouth daily.     Marland Kitchen b complex vitamins tablet Take 2 tablets by mouth daily.    . Biotin 5000 MCG TABS Take 1 tablet by mouth.    Marland Kitchen CALCIUM-MAGNESIUM PO Take 1 tablet by mouth.    . Calcium-Vitamin D 600-200 MG-UNIT per tablet Take 1 tablet by mouth daily.     . Flaxseed, Linseed, (FLAX SEED OIL) 1300 MG CAPS Take 2 capsules by mouth daily with lunch.    . levothyroxine (SYNTHROID, LEVOTHROID) 100 MCG tablet Take 1 tablet by mouth daily.    . Multiple Minerals-Vitamins (CALCIUM & VIT D3 BONE HEALTH PO) Take 1,200 mg by mouth daily.    . Multiple Vitamin (MULTIVITAMIN) capsule Take 1 capsule by mouth daily.    . Multiple Vitamins-Minerals (HAIR SKIN AND NAILS FORMULA) TABS Take 1 tablet by mouth.    . vitamin E 400 UNIT capsule Take 400 Units by mouth daily.    . rosuvastatin (CRESTOR) 5 MG tablet Take 0.5 tablets by mouth daily.     No current facility-administered medications for this visit.     PHYSICAL EXAMINATION: ECOG PERFORMANCE STATUS: 0 - Asymptomatic  BP 128/87   Pulse (!) 56   Temp 97.9 F (36.6 C) (Tympanic)   Resp 20   Ht _0  (1.626 m)   Wt 134 lb (60.8 kg)   BMI 23.00 kg/m   Filed Weights   09/19/18 1100  Weight: 134 lb (60.8 kg)    Physical Exam  Constitutional: She is oriented to person, place, and time and well-developed, well-nourished, and in no distress.  HENT:  Head: Normocephalic and atraumatic.  Mouth/Throat: Oropharynx is clear and moist. No oropharyngeal exudate.   Eyes: Pupils are equal, round, and reactive to light.  Neck: Normal range of motion. Neck supple.  Cardiovascular: Normal rate and regular rhythm.  Pulmonary/Chest: No respiratory distress. She has no wheezes.  Abdominal: Soft. Bowel sounds are normal. She exhibits no distension and no mass. There is no abdominal tenderness. There is no rebound and no guarding.  Musculoskeletal: Normal range of motion.        General: No tenderness or edema.  Neurological: She is alert and oriented to person, place, and time.  Skin: Skin is warm.  Breast exam  (in presence of nurse) right breast mastectomy ; implant-no lumps or bumps.  Left breast no unusual skin changes or dominant masses felt-implant in place   Psychiatric: Affect normal.  LABORATORY DATA:  I have reviewed the data as listed    Component Value Date/Time   NA 140 09/19/2018 1036   NA 140 01/27/2014 1538   K 3.9 09/19/2018 1036   K 3.7 01/27/2014 1538   CL 104 09/19/2018 1036   CL 103 01/27/2014 1538   CO2 27 09/19/2018 1036   CO2 34 (H) 01/27/2014 1538   GLUCOSE 104 (H) 09/19/2018 1036   GLUCOSE 140 (H) 01/27/2014 1538   BUN 30 (H) 09/19/2018 1036   BUN 18 01/27/2014 1538   CREATININE 0.99 09/19/2018 1036   CREATININE 1.02 01/27/2014 1538   CALCIUM 9.3 09/19/2018 1036   CALCIUM 8.4 (L) 01/27/2014 1538   PROT 7.0 09/19/2018 1036   PROT 6.7 01/27/2014 1538   ALBUMIN 4.2 09/19/2018 1036   ALBUMIN 3.4 01/27/2014 1538   AST 26 09/19/2018 1036   AST 22 01/27/2014 1538   ALT 22 09/19/2018 1036   ALT 22 01/27/2014 1538   ALKPHOS 52 09/19/2018 1036   ALKPHOS 49 01/27/2014 1538   BILITOT 0.6 09/19/2018 1036   BILITOT 0.3 01/27/2014 1538   GFRNONAA >60 09/19/2018 1036   GFRNONAA >60 01/27/2014 1538   GFRNONAA >60 05/22/2013 1212   GFRAA >60 09/19/2018 1036   GFRAA >60 01/27/2014 1538   GFRAA >60 05/22/2013 1212    No results found for: SPEP, UPEP  Lab Results  Component Value Date   WBC 5.1 09/19/2018   NEUTROABS  3.6 09/19/2018   HGB 15.1 (H) 09/19/2018   HCT 46.1 (H) 09/19/2018   MCV 90.9 09/19/2018   PLT 190 09/19/2018      Chemistry      Component Value Date/Time   NA 140 09/19/2018 1036   NA 140 01/27/2014 1538   K 3.9 09/19/2018 1036   K 3.7 01/27/2014 1538   CL 104 09/19/2018 1036   CL 103 01/27/2014 1538   CO2 27 09/19/2018 1036   CO2 34 (H) 01/27/2014 1538   BUN 30 (H) 09/19/2018 1036   BUN 18 01/27/2014 1538   CREATININE 0.99 09/19/2018 1036   CREATININE 1.02 01/27/2014 1538      Component Value Date/Time   CALCIUM 9.3 09/19/2018 1036   CALCIUM 8.4 (L) 01/27/2014 1538   ALKPHOS 52 09/19/2018 1036   ALKPHOS 49 01/27/2014 1538   AST 26 09/19/2018 1036   AST 22 01/27/2014 1538   ALT 22 09/19/2018 1036   ALT 22 01/27/2014 1538   BILITOT 0.6 09/19/2018 1036   BILITOT 0.3 01/27/2014 1538       RADIOGRAPHIC STUDIES: I have personally reviewed the radiological images as listed and agreed with the findings in the report. No results found.   ASSESSMENT & PLAN:  Carcinoma of overlapping 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, estrogen receptor positive ([place]) # stage 1 right breast ER/PR positive, HER-2 not overexpressed.  Finished tamoxifen sep 2017; under active surveillance.  Stable.  #  No clinical evidence of recurrence. Needs mammogram/unilateral left-this month.  # Dexa Scan: Done in April 2018- T score -2.1. Continue calcium and vitamin D/exercise.  Will order BMD with mammogram.  Discussed regarding Prolia/Reclast treatment options.  Discussed the mechanism of action and also potential side effects including but not limited to hypocalcemia osteonecrosis of the jaw.  Patient had previous poor tolerance of Fosamax/hair loss.  #Hemoglobin hematocrit-slightly elevated; would not recommend any work-up at this time.  Discussed with the patient.  We will continue to monitor.  # DISPOSITION:  # Left mammogram/ Bone density  in next weeks.  # follow up in 12 months; MD-labs- cbc/cmp  ca-27-29.-Dr.B    Orders Placed This Encounter  Procedures  . DG Bone Density    Standing Status:   Future    Standing Expiration Date:   09/19/2019    Order Specific Question:   Reason for Exam (SYMPTOM  OR DIAGNOSIS REQUIRED)    Answer:   history of breast cancer    Order Specific Question:   Is the patient pregnant?    Answer:   No    Order Specific Question:   Preferred imaging location?    Answer:   [facility]  . MM 3D SCREEN BREAST W/IMPLANT UNI LEFT    Standing Status:   Future    Standing Expiration Date:   11/19/2019    Order Specific Question:   Reason for Exam (SYMPTOM  OR DIAGNOSIS REQUIRED)    Answer:   left breast cancer    Order Specific Question:   Is the patient pregnant?    Answer:   No    Order Specific Question:   Preferred imaging location?    Answer:   [facility]  . CBC with Differential    Standing Status:   Future    Standing Expiration Date:   09/19/2019  . Comprehensive metabolic panel    Standing Status:   Future    Standing Expiration Date:   09/19/2019  . Cancer antigen 27.29    Standing Status:   Future    Standing Expiration Date:   09/19/2019   All questions were answered. The patient knows to call the clinic with any problems, questions or concerns.      Cammie Sickle, MD 09/19/2018 12:36 PM

## 2018-09-19 NOTE — Assessment & Plan Note (Addendum)
#  stage 1 right breast ER/PR positive, HER-2 not overexpressed.  Finished tamoxifen sep 2017; under active surveillance.  Stable.  #  No clinical evidence of recurrence. Needs mammogram/unilateral left-this month.  # Dexa Scan: Done in April 2018- T score -2.1. Continue calcium and vitamin D/exercise.  Will order BMD with mammogram.  Discussed regarding Prolia/Reclast treatment options.  Discussed the mechanism of action and also potential side effects including but not limited to hypocalcemia osteonecrosis of the jaw.  Patient had previous poor tolerance of Fosamax/hair loss.  #Hemoglobin hematocrit-slightly elevated; would not recommend any work-up at this time.  Discussed with the patient.  We will continue to monitor.  # DISPOSITION:  # Left mammogram/ Bone density in next weeks.  # follow up in 12 months; MD-labs- cbc/cmp ca-27-29.-Dr.B

## 2018-09-20 LAB — CANCER ANTIGEN 27.29: CA 27.29: 19.7 U/mL (ref 0.0–38.6)

## 2018-09-23 ENCOUNTER — Telehealth: Payer: Self-pay | Admitting: *Deleted

## 2018-09-23 NOTE — Telephone Encounter (Signed)
Contacted the patient with test results. She thanked me for calling. She gave verbal understanding of the results/plan of care

## 2018-09-23 NOTE — Telephone Encounter (Signed)
-----   Message from Cammie Sickle, MD sent at 09/22/2018 12:44 PM EDT ----- Please inform patient that her tumor marker is normal; please follow-up as recommended no new recommendations. GB

## 2018-10-04 ENCOUNTER — Inpatient Hospital Stay: Payer: 59

## 2018-10-04 ENCOUNTER — Other Ambulatory Visit: Payer: Self-pay

## 2018-10-04 ENCOUNTER — Emergency Department: Payer: 59

## 2018-10-04 ENCOUNTER — Inpatient Hospital Stay
Admission: EM | Admit: 2018-10-04 | Discharge: 2018-10-06 | DRG: 390 | Disposition: A | Discharge status: Home / Self Care | Payer: 59 | Attending: Surgery | Admitting: Surgery

## 2018-10-04 DIAGNOSIS — K56609 Unspecified intestinal obstruction, unspecified as to partial versus complete obstruction: Secondary | ICD-10-CM | POA: Diagnosis not present

## 2018-10-04 DIAGNOSIS — Z90710 Acquired absence of both cervix and uterus: Secondary | ICD-10-CM | POA: Diagnosis not present

## 2018-10-04 DIAGNOSIS — K56600 Partial intestinal obstruction, unspecified as to cause: Principal | ICD-10-CM | POA: Diagnosis present

## 2018-10-04 DIAGNOSIS — Z853 Personal history of malignant neoplasm of breast: Secondary | ICD-10-CM | POA: Diagnosis not present

## 2018-10-04 DIAGNOSIS — Z20828 Contact with and (suspected) exposure to other viral communicable diseases: Secondary | ICD-10-CM | POA: Diagnosis present

## 2018-10-04 DIAGNOSIS — Z9221 Personal history of antineoplastic chemotherapy: Secondary | ICD-10-CM

## 2018-10-04 DIAGNOSIS — Z0189 Encounter for other specified special examinations: Secondary | ICD-10-CM

## 2018-10-04 DIAGNOSIS — R1030 Lower abdominal pain, unspecified: Secondary | ICD-10-CM | POA: Diagnosis present

## 2018-10-04 LAB — CBC
HCT: 42.2 % (ref 36.0–46.0)
Hemoglobin: 13.8 g/dL (ref 12.0–15.0)
MCH: 30.1 pg (ref 26.0–34.0)
MCHC: 32.7 g/dL (ref 30.0–36.0)
MCV: 92.1 fL (ref 80.0–100.0)
Platelets: 162 10*3/uL (ref 150–400)
RBC: 4.58 MIL/uL (ref 3.87–5.11)
RDW: 13.4 % (ref 11.5–15.5)
WBC: 7.1 10*3/uL (ref 4.0–10.5)
nRBC: 0 % (ref 0.0–0.2)

## 2018-10-04 LAB — URINALYSIS, COMPLETE (UACMP) WITH MICROSCOPIC
Bilirubin Urine: NEGATIVE
Glucose, UA: 150 mg/dL — AB
Hgb urine dipstick: NEGATIVE
Ketones, ur: 5 mg/dL — AB
Leukocytes,Ua: NEGATIVE
Nitrite: NEGATIVE
Protein, ur: NEGATIVE mg/dL
Specific Gravity, Urine: 1.027 (ref 1.005–1.030)
Squamous Epithelial / HPF: NONE SEEN (ref 0–5)
pH: 7 (ref 5.0–8.0)

## 2018-10-04 LAB — COMPREHENSIVE METABOLIC PANEL
ALT: 19 U/L (ref 0–44)
AST: 28 U/L (ref 15–41)
Albumin: 4.2 g/dL (ref 3.5–5.0)
Alkaline Phosphatase: 47 U/L (ref 38–126)
Anion gap: 8 (ref 5–15)
BUN: 24 mg/dL — ABNORMAL HIGH (ref 6–20)
CO2: 28 mmol/L (ref 22–32)
Calcium: 8.7 mg/dL — ABNORMAL LOW (ref 8.9–10.3)
Chloride: 102 mmol/L (ref 98–111)
Creatinine, Ser: 1.01 mg/dL — ABNORMAL HIGH (ref 0.44–1.00)
GFR calc Af Amer: >60 mL/min (ref >60)
GFR calc non Af Amer: >60 mL/min (ref >60)
Glucose, Bld: 110 mg/dL — ABNORMAL HIGH (ref 70–99)
Potassium: 3.4 mmol/L — ABNORMAL LOW (ref 3.5–5.1)
Sodium: 138 mmol/L (ref 135–145)
Total Bilirubin: 0.6 mg/dL (ref 0.3–1.2)
Total Protein: 7 g/dL (ref 6.5–8.1)

## 2018-10-04 LAB — CREATININE, SERUM
Creatinine, Ser: 0.78 mg/dL (ref 0.44–1.00)
GFR calc Af Amer: >60 mL/min (ref >60)
GFR calc non Af Amer: >60 mL/min (ref >60)

## 2018-10-04 LAB — LIPASE, BLOOD: Lipase: 54 U/L — ABNORMAL HIGH (ref 11–51)

## 2018-10-04 LAB — SARS CORONAVIRUS 2 BY RT PCR (HOSPITAL ORDER, PERFORMED IN ~~LOC~~ HOSPITAL LAB): SARS Coronavirus 2: NEGATIVE

## 2018-10-04 MED ORDER — IOHEXOL 240 MG/ML SOLN
50.0000 mL | Freq: Once | INTRAMUSCULAR | Status: AC
  Administered 2018-10-04: 50 mL via ORAL

## 2018-10-04 MED ORDER — KETOROLAC TROMETHAMINE 30 MG/ML IJ SOLN
30.0000 mg | Freq: Four times a day (QID) | INTRAMUSCULAR | Status: DC | PRN
  Administered 2018-10-04 (×2): 30 mg via INTRAVENOUS
  Filled 2018-10-04 (×2): qty 1

## 2018-10-04 MED ORDER — SODIUM CHLORIDE 0.9 % IV BOLUS
1000.0000 mL | Freq: Once | INTRAVENOUS | Status: AC
  Administered 2018-10-04: 1000 mL via INTRAVENOUS

## 2018-10-04 MED ORDER — DIPHENHYDRAMINE HCL 50 MG/ML IJ SOLN: 12.5000 mg | Freq: Four times a day (QID) | INTRAMUSCULAR | Status: DC | PRN

## 2018-10-04 MED ORDER — ACETAMINOPHEN 650 MG RE SUPP
650.0000 mg | Freq: Four times a day (QID) | RECTAL | Status: DC | PRN
  Administered 2018-10-04: 650 mg via RECTAL
  Filled 2018-10-04: qty 1

## 2018-10-04 MED ORDER — IOHEXOL 300 MG/ML  SOLN
100.0000 mL | Freq: Once | INTRAMUSCULAR | Status: AC | PRN
  Administered 2018-10-04: 100 mL via INTRAVENOUS

## 2018-10-04 MED ORDER — PANTOPRAZOLE SODIUM 40 MG IV SOLR: 40.0000 mg | Freq: Every day | INTRAVENOUS | Status: DC

## 2018-10-04 MED ORDER — ONDANSETRON HCL 4 MG/2ML IJ SOLN
4.0000 mg | Freq: Four times a day (QID) | INTRAMUSCULAR | Status: DC | PRN
  Administered 2018-10-04 (×2): 4 mg via INTRAVENOUS
  Filled 2018-10-04 (×2): qty 2

## 2018-10-04 MED ORDER — PANTOPRAZOLE SODIUM 40 MG IV SOLR
40.0000 mg | Freq: Every day | INTRAVENOUS | Status: DC
  Administered 2018-10-04: 40 mg via INTRAVENOUS
  Filled 2018-10-04 (×3): qty 40

## 2018-10-04 MED ORDER — ACETAMINOPHEN 325 MG PO TABS
650.0000 mg | ORAL_TABLET | Freq: Four times a day (QID) | ORAL | Status: DC | PRN
  Administered 2018-10-05 – 2018-10-06 (×5): 650 mg via ORAL
  Filled 2018-10-04 (×5): qty 2

## 2018-10-04 MED ORDER — ONDANSETRON HCL 4 MG/2ML IJ SOLN
4.0000 mg | Freq: Once | INTRAMUSCULAR | Status: AC
  Administered 2018-10-04: 4 mg via INTRAVENOUS
  Filled 2018-10-04: qty 2

## 2018-10-04 MED ORDER — DIPHENHYDRAMINE HCL 12.5 MG/5ML PO ELIX
12.5000 mg | ORAL_SOLUTION | Freq: Four times a day (QID) | ORAL | Status: DC | PRN
  Filled 2018-10-04: qty 5

## 2018-10-04 MED ORDER — ONDANSETRON 4 MG PO TBDP: 4.0000 mg | ORAL_TABLET | Freq: Four times a day (QID) | ORAL | Status: DC | PRN

## 2018-10-04 MED ORDER — HYDROMORPHONE HCL 1 MG/ML IJ SOLN
1.0000 mg | Freq: Once | INTRAMUSCULAR | Status: AC
  Administered 2018-10-04: 1 mg via INTRAVENOUS
  Filled 2018-10-04: qty 1

## 2018-10-04 MED ORDER — PROCHLORPERAZINE EDISYLATE 10 MG/2ML IJ SOLN
5.0000 mg | Freq: Four times a day (QID) | INTRAMUSCULAR | Status: DC | PRN
  Administered 2018-10-04: 10 mg via INTRAVENOUS
  Filled 2018-10-04 (×6): qty 2

## 2018-10-04 MED ORDER — HYDROMORPHONE HCL 1 MG/ML IJ SOLN: 0.5000 mg | INTRAMUSCULAR | Status: DC | PRN

## 2018-10-04 MED ORDER — MORPHINE SULFATE (PF) 2 MG/ML IV SOLN: 1.0000 mg | INTRAVENOUS | Status: DC | PRN

## 2018-10-04 MED ORDER — DEXTROSE IN LACTATED RINGERS 5 % IV SOLN
INTRAVENOUS | Status: DC
  Administered 2018-10-04 – 2018-10-05 (×3): via INTRAVENOUS

## 2018-10-04 MED ORDER — PROCHLORPERAZINE MALEATE 10 MG PO TABS
10.0000 mg | ORAL_TABLET | Freq: Four times a day (QID) | ORAL | Status: DC | PRN
  Filled 2018-10-04: qty 1

## 2018-10-04 MED ORDER — ENOXAPARIN SODIUM 40 MG/0.4ML ~~LOC~~ SOLN: 40.0000 mg | SUBCUTANEOUS | Status: DC

## 2018-10-04 MED ORDER — LEVOTHYROXINE SODIUM 50 MCG PO TABS
100.0000 ug | ORAL_TABLET | Freq: Every day | ORAL | Status: DC
  Administered 2018-10-05 – 2018-10-06 (×2): 100 ug via ORAL
  Filled 2018-10-04 (×3): qty 2

## 2018-10-04 MED ORDER — HYDRALAZINE HCL 20 MG/ML IJ SOLN: 10.0000 mg | INTRAMUSCULAR | Status: DC | PRN

## 2018-10-04 NOTE — ED Notes (Signed)
Patient was given hydromorphone and Zofran. Patient became dizzy and had episode of emesis.

## 2018-10-04 NOTE — Plan of Care (Signed)
  Problem: Education: Goal: Knowledge of General Education information will improve Description: Including pain rating scale, medication(s)/side effects and non-pharmacologic comfort measures Outcome: Progressing   Problem: Health Behavior/Discharge Planning: Goal: Ability to manage health-related needs will improve Outcome: Progressing   Problem: Clinical Measurements: Goal: Ability to maintain clinical measurements within normal limits will improve Outcome: Progressing   Problem: Clinical Measurements: Goal: Diagnostic test results will improve Outcome: Progressing   Problem: Coping: Goal: Level of anxiety will decrease Outcome: Progressing   Problem: Nutrition: Goal: Adequate nutrition will be maintained Outcome: Progressing

## 2018-10-04 NOTE — Progress Notes (Signed)
No output noted from NG tube since pt admitted to floor, small amount of mucous with streaks of brownish blood noted in tube, but no measurable output in canister. Unable to find placement verification in pt's chart, MD paged. Per Dr. Dahlia Byes, order placed for STAT xray to verify tube placement.  Also, pt unable to tolerate IV dialudid due to side effects, verbal order per Dr. Dahlia Byes for: IV morphine 1-2mg  every 2 hours PRN for pain. D/C'd order for dilaudid.

## 2018-10-04 NOTE — ED Provider Notes (Signed)
[facility] Emergency Department Provider Note ____________________________________________   First MD Initiated Contact with Patient 10/04/18 0251     (approximate)  I have reviewed the triage vital signs and the nursing notes.   HISTORY  Chief Complaint Abdominal Pain    HPI Susan Green is a 58 y.o. female with PMH as noted below who presents with lower abdominal pain, acute onset this evening, severe intensity, and nonradiating.  She had a bowel movement after the pain started and felt like she was about to pass out.  She denies any nausea or vomiting.  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  Past Medical History:  Diagnosis Date   174.9 2011   Right breast T1c,N1 (mic),ER 90%; PR 90%, Her 2 neu not over expressed treated with chemotherapy and reconstruction   Breast cancer ([place]) 01/2009   Right with Chemo   Cancer ([place])    Personal history of chemotherapy 2010   BREAST CA   PONV (postoperative nausea and vomiting)    very naseated   Psoriasis    Thyroid disease    hypothyroidism    Patient Active Problem List   Diagnosis Date Noted   Carcinoma of overlapping 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, estrogen receptor positive ([place]) 08/24/2016   Mass of arm, left 05/09/2016    Past Surgical History:  Procedure Laterality Date   ABDOMINAL HYSTERECTOMY  2011   ABDOMINAL HYSTERECTOMY     APPENDECTOMY  04/29/13   APPENDECTOMY     AUGMENTATION MAMMAPLASTY Bilateral 08/2009   Reconstruction   BREAST BIOPSY Right 2010   Positive   BREAST RECONSTRUCTION  2011, 2012   3   BREAST RECONSTRUCTION Right 2011   BREAST SURGERY Right 01/25/2009   mastectomy   BREAST SURGERY Left 2011   made to match the right breast   BREAST SURGERY     cecopexy  04-29-13   CRANIOTOMY FOR AVM  1998   CRANIOTOMY FOR AVM     DILATION AND CURETTAGE OF UTERUS  2008   polyp    KNEE ARTHROSCOPY Right 2005   KNEE ARTHROSCOPY Left 09/22/2014   Procedure: Left knee arthroscopy, partial medial menisectomy;  Surgeon: Dereck Leep, MD;  Location: [facility] ORS;  Service: Orthopedics;  Laterality: Left;   KNEE ARTHROSCOPY Bilateral    MASTECTOMY Right 2010   BREAST CA   PORTACATH PLACEMENT  2011   TONSILLECTOMY AND ADENOIDECTOMY  1966    Prior to Admission medications   Medication Sig Start Date End Date Taking? Authorizing Provider  Apremilast (OTEZLA) 30 MG TABS Take 1 tablet by mouth daily.     [provider]  b complex vitamins tablet Take 2 tablets by mouth daily.    [provider]  Biotin 5000 MCG TABS Take 1 tablet by mouth.    [provider]  CALCIUM-MAGNESIUM PO Take 1 tablet by mouth.    [provider]  Calcium-Vitamin D 600-200 MG-UNIT per tablet Take 1 tablet by mouth daily.     [provider]  Flaxseed, Linseed, (FLAX SEED OIL) 1300 MG CAPS Take 2 capsules by mouth daily with lunch.    [provider]  levothyroxine (SYNTHROID, LEVOTHROID) 100 MCG tablet Take 1 tablet by mouth daily. 10/10/12   [provider]  Multiple Minerals-Vitamins (CALCIUM & VIT D3 BONE HEALTH PO) Take 1,200 mg by mouth daily.    [provider]  Multiple Vitamin (MULTIVITAMIN) capsule Take 1 capsule by mouth daily.    [provider]  Multiple Vitamins-Minerals (HAIR SKIN AND NAILS FORMULA) TABS Take 1 tablet by mouth.    [provider]  rosuvastatin (CRESTOR) 5 MG tablet Take 0.5 tablets by mouth daily. 05/23/18   [provider]  vitamin E 400 UNIT capsule Take 400 Units by mouth daily.    [provider]    Allergies Alendronate, Codeine, Codeine, Demerol [meperidine], and Demerol [meperidine]  Family History  Problem Relation Age of Onset   Healthy Mother    Healthy Father    Colon cancer Paternal Grandfather 32   Breast cancer Maternal Aunt    Cancer -  Other Sister 84       Vulvar    Social History Social History   Tobacco Use   Smoking status: Never Smoker   Smokeless tobacco: Never Used  Substance Use Topics   Alcohol use: Never    Frequency: Never   Drug use: Never    Review of Systems  Constitutional: No fever/chills Eyes: No visual changes. ENT: No sore throat. Cardiovascular: Denies chest pain. Respiratory: Denies shortness of breath. Gastrointestinal: No vomiting or diarrhea. Genitourinary: Negative for dysuria.  Musculoskeletal: Negative for back pain. Skin: Negative for rash. Neurological: Negative for headache.   ____________________________________________   PHYSICAL EXAM:  VITAL SIGNS: ED Triage Vitals  Enc Vitals Group     BP 10/04/18 0249 138/81     Pulse Rate 10/04/18 0249 (!) 58     Resp 10/04/18 0249 (!) 21     Temp 10/04/18 0249 97.6 F (36.4 C)     Temp Source 10/04/18 0249 Oral     SpO2 10/04/18 0249 98 %     Weight 10/04/18 0246 128 lb (58.1 kg)     Height 10/04/18 0246 5\' 4"  (1.626 m)     Head Circumference --      Peak Flow --      Pain Score 10/04/18 0246 9     Pain Loc --      Pain Edu? --      Excl. in [place]? --     Constitutional: Alert and oriented.  Uncomfortable appearing but in no acute distress.   Eyes: Conjunctivae are normal.  No scleral icterus. Head: Atraumatic. Nose: No congestion/rhinnorhea. Mouth/Throat: Mucous membranes are slightly dry. Neck: Normal range of motion.  Cardiovascular: Borderline bradycardic, regular rhythm.Good peripheral circulation. Respiratory: Normal respiratory effort.  No retractions.  Gastrointestinal: Soft with moderate bilateral lower quadrant tenderness.  No distention.  Genitourinary: No flank tenderness. Musculoskeletal: Extremities warm and well perfused.  Neurologic:  Normal speech and language. No gross focal neurologic deficits are appreciated.  Skin:  Skin is warm and dry. No rash noted. Psychiatric: Mood and affect are  normal. Speech and behavior are normal.  ____________________________________________   LABS (all labs ordered are listed, but only abnormal results are displayed)  Labs Reviewed  LIPASE, BLOOD - Abnormal; Notable for the following components:      Result Value   Lipase 54 (*)    All other components within normal limits  COMPREHENSIVE METABOLIC PANEL - Abnormal; Notable for the following components:   Potassium 3.4 (*)    Glucose, Bld 110 (*)    BUN 24 (*)    Creatinine, Ser 1.01 (*)    Calcium 8.7 (*)    All other components within normal limits  SARS CORONAVIRUS 2  CBC  URINALYSIS, COMPLETE (UACMP) WITH MICROSCOPIC  POC  URINE PREG, ED   ____________________________________________  EKG   ____________________________________________  RADIOLOGY  CT abdomen: Small bowel obstruction with transition point in the left pelvis, possible adhesion ____________________________________________   PROCEDURES  Procedure(s) performed: No  Procedures  Critical Care performed: No ____________________________________________   INITIAL IMPRESSION / ASSESSMENT AND PLAN / ED COURSE  Pertinent labs & imaging results that were available during my care of the patient were reviewed by me and considered in my medical decision making (see chart for details).  58 year old female with PMH as noted above presents with acute onset of lower abdominal pain this evening.  The patient had one bowel movement and states that she felt near syncopal but this has improved.  She has no nausea or vomiting.  She reports that the symptoms are very similar to when she had a volvulus several years ago which required surgery.  I reviewed the past medical records in [place].  The patient was seen in the ED in March 2015 with lower abdominal pain and CT showed malpositioning of the cecum suggesting a developing volvulus.  She was initially admitted for observation but then required surgery with an cecopexy and  appendectomy performed at that time.  On exam the patient is uncomfortable but not acutely ill-appearing.  Her vital signs are normal.  She has tenderness in the bilateral lower abdomen.  Presentation is concerning for recurrent volvulus, small bowel obstruction, diverticulitis, or colitis.  We will give analgesia, fluids, obtain lab work-up and CT abdomen.  ----------------------------------------- 5:22 AM on 10/04/2018 -----------------------------------------  CT shows findings compatible with small bowel obstruction.  I consulted Dr. Dahlia Byes from general surgery who recommended placement of an NG tube and he will admit the patient.  ____________________________  Oley Balm was evaluated in Emergency Department on 10/04/2018 for the symptoms described in the history of present illness. She was evaluated in the context of the global COVID-19 pandemic, which necessitated consideration that the patient might be at risk for infection with the SARS-CoV-2 virus that causes COVID-19. Institutional protocols and algorithms that pertain to the evaluation of patients at risk for COVID-19 are in a state of rapid change based on information released by regulatory bodies including the CDC and federal and state organizations. These policies and algorithms were followed during the patient's care in the ED. ____________________________________________   FINAL CLINICAL IMPRESSION(S) / ED DIAGNOSES  Final diagnoses:  Small bowel obstruction ([place])      NEW MEDICATIONS STARTED DURING THIS VISIT:  New Prescriptions   No medications on file     Note:  This document was prepared using Dragon voice recognition software and may include unintentional dictation errors.    Arta Silence, MD 10/04/18 [phone]

## 2018-10-04 NOTE — ED Notes (Signed)
Patient transported to CT 

## 2018-10-04 NOTE — Progress Notes (Signed)
SCD's placed, pt declines lovenox at this time. Education provided.

## 2018-10-04 NOTE — H&P (Signed)
Patient ID: Susan Green, female   DOB: September 22, 1960, 58 y.o.   MRN: [id number]  HPI Susan Green is a 58 y.o. female since to the emergency room complaining of lower abdominal pain that started last night.  The onset was acute and intensity was severe and sharp in nature.  This was a nonradiating pain.  She also reports some nausea.  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  She did have a history of appendectomy and cecopexy by Dr. Jamal Collin several years ago.  The emergency room she did have nausea and vomiting and she attributed to the Dilaudid that she received.  CT scan personally reviewed there is evidence of partial small bowel obstruction with mildly dilated loops of small bowel.  No free air no closed-loop obstruction.  CBC is normal CMP shows an elevated BUN of 24 with a slight increase in creatinine of 1.01.  She is able to perform more than 4 METS of activity without any shortness of breath or chest pain.  She did have a history of breast cancer on the right side status post mastectomy with immediate reconstruction on the left with prosthesis for symmetry.  Did receive chemotherapy as well. 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  HPI  Past Medical History:  Diagnosis Date  . 174.9 2011   Right breast T1c,N1 (mic),ER 90%; PR 90%, Her 2 neu not over expressed treated with chemotherapy and reconstruction  . Breast cancer ([place]) 01/2009   Right with Chemo  . Cancer ([place])   . Personal history of chemotherapy 2010   BREAST CA  . PONV (postoperative nausea and vomiting)    very naseated  . Psoriasis   . Thyroid disease    hypothyroidism    Past Surgical History:  Procedure Laterality Date  . ABDOMINAL HYSTERECTOMY  2011  . ABDOMINAL HYSTERECTOMY    . APPENDECTOMY  04/29/13  . APPENDECTOMY    . AUGMENTATION MAMMAPLASTY Bilateral 08/2009   Reconstruction  . BREAST BIOPSY Right 2010   Positive  . BREAST RECONSTRUCTION  2011, 2012   3  . BREAST RECONSTRUCTION Right 2011  . BREAST SURGERY Right 01/25/2009   mastectomy  . BREAST SURGERY Left 2011   made to match the right breast  . BREAST SURGERY    . cecopexy  04-29-13  . CRANIOTOMY FOR AVM  1998  . CRANIOTOMY FOR AVM    . DILATION AND CURETTAGE OF UTERUS  2008   polyp  . KNEE ARTHROSCOPY Right 2005  . KNEE ARTHROSCOPY Left 09/22/2014   Procedure: Left knee arthroscopy, partial medial menisectomy;  Surgeon: Dereck Leep, MD;  Location: [facility] ORS;  Service: Orthopedics;  Laterality: Left;  . KNEE ARTHROSCOPY Bilateral   . MASTECTOMY Right 2010   BREAST CA  . PORTACATH PLACEMENT  2011  . TONSILLECTOMY AND ADENOIDECTOMY  1966    Family History  Problem Relation Age of Onset  . Healthy Mother   . Healthy Father   . Colon cancer Paternal Grandfather 78  . Breast cancer Maternal Aunt   . Cancer - Other Sister 84       Vulvar    Social History Social History   Tobacco Use  . Smoking status: Never Smoker  . Smokeless tobacco: Never Used  Substance Use Topics  . Alcohol use: Never    Frequency: Never  .  Drug use: Never    Allergies  Allergen Reactions  . Dilaudid [Hydromorphone Hcl] Nausea And Vomiting  . Alendronate     Other reaction(s): Other (See Comments) alopecia  . Codeine Nausea And Vomiting  . Codeine   . Demerol [Meperidine] Nausea Only  . Demerol [Meperidine]     Current Facility-Administered Medications  Medication Dose Route Frequency Provider Last Rate Last Dose  . acetaminophen (TYLENOL) tablet 650 mg  650 mg Oral Q6H PRN ,  F, MD       Or  . acetaminophen (TYLENOL) suppository 650 mg  650 mg Rectal Q6H PRN Jules Husbands, MD   650 mg at 10/04/18 0934  . dextrose 5 % in lactated ringers infusion   Intravenous Continuous , [place] F, MD 100 mL/hr at 10/04/18 0939    . diphenhydrAMINE (BENADRYL) 12.5 MG/5ML elixir 12.5 mg   12.5 mg Oral Q6H PRN ,  F, MD       Or  . diphenhydrAMINE (BENADRYL) injection 12.5 mg  12.5 mg Intravenous Q6H PRN ,  F, MD      . enoxaparin (LOVENOX) injection 40 mg  40 mg Subcutaneous Q24H ,  F, MD      . hydrALAZINE (APRESOLINE) injection 10 mg  10 mg Intravenous Q2H PRN ,  F, MD      . HYDROmorphone (DILAUDID) injection 0.5 mg  0.5 mg Intravenous Q2H PRN ,  F, MD      . ketorolac (TORADOL) 30 MG/ML injection 30 mg  30 mg Intravenous Q6H PRN Caroleen Hamman F, MD   30 mg at 10/04/18 7741  . levothyroxine (SYNTHROID) tablet 100 mcg  100 mcg Oral Q0600 ,  F, MD      . ondansetron (ZOFRAN-ODT) disintegrating tablet 4 mg  4 mg Oral Q6H PRN ,  F, MD       Or  . ondansetron (ZOFRAN) injection 4 mg  4 mg Intravenous Q6H PRN Jules Husbands, MD   4 mg at 10/04/18 2878  . pantoprazole (PROTONIX) injection 40 mg  40 mg Intravenous QHS ,  F, MD      . prochlorperazine (COMPAZINE) tablet 10 mg  10 mg Oral Q6H PRN ,  F, MD       Or  . prochlorperazine (COMPAZINE) injection 5-10 mg  5-10 mg Intravenous Q6H PRN ,  F, MD   10 mg at 10/04/18 1054     Review of Systems Full ROS  was asked and was negative except for the information on the HPI  Physical Exam Blood pressure 96/63, pulse 61, temperature 97.9 F (36.6 C), temperature source Oral, resp. rate 18, height 5\' 4"  (1.626 m), weight 58.1 kg, SpO2 94 %. CONSTITUTIONAL: NAD EYES: Pupils are equal, round, and reactive to light, Sclera are non-icteric. EARS, NOSE, MOUTH AND THROAT: The oropharynx is clear. The oral mucosa is pink and moist. Hearing is intact to voice. LYMPH NODES:  Lymph nodes in the neck are normal. RESPIRATORY:  Lungs are clear. There is normal respiratory effort, with equal breath sounds bilaterally, and without pathologic use of accessory muscles. CARDIOVASCULAR: Heart is regular without murmurs, gallops, or rubs. GI: The abdomen is  soft,  Mildly distended, decrease BS, no rebound or peritonitis.. There are no palpable masses. There is no hepatosplenomegaly.  MUSCULOSKELETAL: Normal muscle strength and tone. No cyanosis or edema.   SKIN: Turgor is good and there are no pathologic skin lesions or ulcers. NEUROLOGIC: Motor and sensation is grossly normal. Cranial nerves  are grossly intact. PSYCH:  Oriented to person, place and time. Affect is normal.  Data Reviewed  I have personally reviewed the patient's imaging, laboratory findings and medical records.    Assessment/Plan This a42 year old female with findings consistent with partial small bowel obstruction.  We will hydrate her place an NG tube for decompression continue serial abdominal exams and serial abdominal x-ray in the morning.  Recheck CBC and lytes in am. No need for surgical intervention at this time.  Discussed with the patient in detail and extensive counseling was provided   Caroleen Hamman, MD [place] [facility] 10/04/2018, 12:19 PM

## 2018-10-04 NOTE — ED Triage Notes (Signed)
Patient c/o lower abdominal pain beginning at 2230. Patient reports similar occurrence a couple of years ago and was dx with hyper mobile early volvulus. Patient was admitted. Patient had cecum stitched into place and appendix removed. Patient reports this pain feels the same.

## 2018-10-04 NOTE — ED Notes (Signed)
ED TO INPATIENT HANDOFF REPORT  ED Nurse Name and Phone #: Anderson Malta [phone]  S Name/Age/Gender Susan Green 58 y.o. female Room/Bed: ED19A/ED19A  Code Status   Code Status: Not on file  Home/SNF/Other Home Patient oriented to: self, place, time and situation Is this baseline? Yes   Triage Complete: Triage complete  Chief Complaint abd pain  Triage Note Patient c/o lower abdominal pain beginning at 2230. Patient reports similar occurrence a couple of years ago and was dx with hyper mobile early volvulus. Patient was admitted. Patient had cecum stitched into place and appendix removed. Patient reports this pain feels the same.    Allergies Allergies  Allergen Reactions  . Dilaudid [Hydromorphone Hcl] Nausea And Vomiting  . Alendronate     Other reaction(s): Other (See Comments) alopecia  . Codeine Nausea And Vomiting  . Codeine   . Demerol [Meperidine] Nausea Only  . Demerol [Meperidine]     Level of Care/Admitting Diagnosis ED Disposition    ED Disposition Condition [facility] Area: [place] [100120]  Level of Care: Med-Surg [16]  Covid Evaluation: Asymptomatic Screening Protocol (No Symptoms)  Diagnosis: SBO (small bowel obstruction) [facility]) [[id number]]  Admitting Physician: Jules Husbands [[id number]]  Attending Physician: Jules Husbands [[id number]]  Estimated length of stay: 3 - 4 days  Certification:: I certify this patient will need inpatient services for at least 2 midnights  PT Class (Do Not Modify): Inpatient [101]  PT Acc Code (Do Not Modify): Private [1]       B Medical/Surgery History Past Medical History:  Diagnosis Date  . 174.9 2011   Right breast T1c,N1 (mic),ER 90%; PR 90%, Her 2 neu not over expressed treated with chemotherapy and reconstruction  . Breast cancer ([place]) 01/2009   Right with Chemo  . Cancer ([place])   . Personal history of chemotherapy 2010   BREAST CA  . PONV (postoperative nausea and  vomiting)    very naseated  . Psoriasis   . Thyroid disease    hypothyroidism   Past Surgical History:  Procedure Laterality Date  . ABDOMINAL HYSTERECTOMY  2011  . ABDOMINAL HYSTERECTOMY    . APPENDECTOMY  04/29/13  . APPENDECTOMY    . AUGMENTATION MAMMAPLASTY Bilateral 08/2009   Reconstruction  . BREAST BIOPSY Right 2010   Positive  . BREAST RECONSTRUCTION  2011, 2012   3  . BREAST RECONSTRUCTION Right 2011  . BREAST SURGERY Right 01/25/2009   mastectomy  . BREAST SURGERY Left 2011   made to match the right breast  . BREAST SURGERY    . cecopexy  04-29-13  . CRANIOTOMY FOR AVM  1998  . CRANIOTOMY FOR AVM    . DILATION AND CURETTAGE OF UTERUS  2008   polyp  . KNEE ARTHROSCOPY Right 2005  . KNEE ARTHROSCOPY Left 09/22/2014   Procedure: Left knee arthroscopy, partial medial menisectomy;  Surgeon: Dereck Leep, MD;  Location: [facility] ORS;  Service: Orthopedics;  Laterality: Left;  . KNEE ARTHROSCOPY Bilateral   . MASTECTOMY Right 2010   BREAST CA  . PORTACATH PLACEMENT  2011  . TONSILLECTOMY AND ADENOIDECTOMY  1966     A IV Location/Drains/Wounds Patient Lines/Drains/Airways Status   Active Line/Drains/Airways    Name:   Placement date:   Placement time:   Site:   Days:   Peripheral IV 10/04/18 Left Antecubital   10/04/18    0258    Antecubital   less than 1  NG/OG Tube Nasogastric 14 Fr. Left nare Aucultation Documented cm marking at nare/ corner of mouth 53 cm   10/04/18    0600    Left nare   less than 1   Incision (Closed) 09/22/14 Knee Left   09/22/14    1628     1473          Intake/Output Last 24 hours No intake or output data in the 24 hours ending 10/04/18 0758  Labs/Imaging Results for orders placed or performed during the hospital encounter of 10/04/18 (from the past 48 hour(s))  Lipase, blood     Status: Abnormal   Collection Time: 10/04/18  2:58 AM  Result Value Ref Range   Lipase 54 (H) 11 - 51 U/L    Comment: Performed at [facility], [address] [place], [place] [address]  Comprehensive metabolic panel     Status: Abnormal   Collection Time: 10/04/18  2:58 AM  Result Value Ref Range   Sodium 138 135 - 145 mmol/L   Potassium 3.4 (L) 3.5 - 5.1 mmol/L   Chloride 102 98 - 111 mmol/L   CO2 28 22 - 32 mmol/L   Glucose, Bld 110 (H) 70 - 99 mg/dL   BUN 24 (H) 6 - 20 mg/dL   Creatinine, Ser 1.01 (H) 0.44 - 1.00 mg/dL   Calcium 8.7 (L) 8.9 - 10.3 mg/dL   Total Protein 7.0 6.5 - 8.1 g/dL   Albumin 4.2 3.5 - 5.0 g/dL   AST 28 15 - 41 U/L   ALT 19 0 - 44 U/L   Alkaline Phosphatase 47 38 - 126 U/L   Total Bilirubin 0.6 0.3 - 1.2 mg/dL   GFR calc non Af Amer >60 >60 mL/min   GFR calc Af Amer >60 >60 mL/min   Anion gap 8 5 - 15    Comment: Performed at [facility], [address] [place], [place] [address]  CBC     Status: None   Collection Time: 10/04/18  2:58 AM  Result Value Ref Range   WBC 7.1 4.0 - 10.5 K/uL   RBC 4.58 3.87 - 5.11 MIL/uL   Hemoglobin 13.8 12.0 - 15.0 g/dL   HCT 42.2 36.0 - 46.0 %   MCV 92.1 80.0 - 100.0 fL   MCH 30.1 26.0 - 34.0 pg   MCHC 32.7 30.0 - 36.0 g/dL   RDW 13.4 11.5 - 15.5 %   Platelets 162 150 - 400 K/uL   nRBC 0.0 0.0 - 0.2 %    Comment: Performed at [facility], [address] [place],  [address]  SARS Coronavirus 2 [facility] order, Performed in [facility] hospital lab) Nasopharyngeal Nasopharyngeal Swab     Status: None   Collection Time: 10/04/18  6:18 AM   Specimen: Nasopharyngeal Swab  Result Value Ref Range   SARS Coronavirus 2 NEGATIVE NEGATIVE    Comment: (NOTE) If result is NEGATIVE SARS-CoV-2 target nucleic acids are NOT DETECTED. The SARS-CoV-2 RNA is generally detectable in upper and lower  respiratory specimens during the acute phase of infection. The lowest  concentration of SARS-CoV-2 viral copies this assay can detect is 250  copies / mL. A negative result does not preclude SARS-CoV-2 infection  and should not be used as the sole  basis for treatment or other  patient management decisions.  A negative result may occur with  improper specimen collection / handling, submission of specimen other  than nasopharyngeal swab, presence of  viral mutation(s) within the  areas targeted by this assay, and inadequate number of viral copies  (<250 copies / mL). A negative result must be combined with clinical  observations, patient history, and epidemiological information. If result is POSITIVE SARS-CoV-2 target nucleic acids are DETECTED. The SARS-CoV-2 RNA is generally detectable in upper and lower  respiratory specimens dur ing the acute phase of infection.  Positive  results are indicative of active infection with SARS-CoV-2.  Clinical  correlation with patient history and other diagnostic information is  necessary to determine patient infection status.  Positive results do  not rule out bacterial infection or co-infection with other viruses. If result is PRESUMPTIVE POSTIVE SARS-CoV-2 nucleic acids MAY BE PRESENT.   A presumptive positive result was obtained on the submitted specimen  and confirmed on repeat testing.  While 2019 novel coronavirus  (SARS-CoV-2) nucleic acids may be present in the submitted sample  additional confirmatory testing may be necessary for epidemiological  and / or clinical management purposes  to differentiate between  SARS-CoV-2 and other Sarbecovirus currently known to infect humans.  If clinically indicated additional testing with an alternate test  methodology [phone]) is advised. The SARS-CoV-2 RNA is generally  detectable in upper and lower respiratory sp ecimens during the acute  phase of infection. The expected result is Negative. Fact Sheet for Patients:  StrictlyIdeas.no Fact Sheet for Healthcare Providers: BankingDealers.co.za This test is not yet approved or cleared by the [place] FDA and has been authorized for detection  and/or diagnosis of SARS-CoV-2 by FDA under an Emergency Use Authorization (EUA).  This EUA will remain in effect (meaning this test can be used) for the duration of the COVID-19 declaration under Section 564(b)(1) of the Act, 21 U.S.C. section 360bbb-3(b)(1), unless the authorization is terminated or revoked sooner. Performed at [facility], [address] [place], [place] [address]   Urinalysis, Complete w Microscopic     Status: Abnormal   Collection Time: 10/04/18  7:15 AM  Result Value Ref Range   Color, Urine STRAW (A) YELLOW   APPearance CLEAR (A) CLEAR   Specific Gravity, Urine 1.027 1.005 - 1.030   pH 7.0 5.0 - 8.0   Glucose, UA 150 (A) NEGATIVE mg/dL   Hgb urine dipstick NEGATIVE NEGATIVE   Bilirubin Urine NEGATIVE NEGATIVE   Ketones, ur 5 (A) NEGATIVE mg/dL   Protein, ur NEGATIVE NEGATIVE mg/dL   Nitrite NEGATIVE NEGATIVE   Leukocytes,Ua NEGATIVE NEGATIVE   RBC / HPF 0-5 0 - 5 RBC/hpf   WBC, UA 0-5 0 - 5 WBC/hpf   Bacteria, UA RARE (A) NONE SEEN   Squamous Epithelial / LPF NONE SEEN 0 - 5    Comment: Performed at [facility], [address], [place], [place] [address]   Ct Abdomen Pelvis W Contrast  Result Date: 10/04/2018 CLINICAL DATA:  Acute onset lower abdominal pain today. Prior cecopexy for volvulus. Personal history of breast carcinoma. EXAM: CT ABDOMEN AND PELVIS WITH CONTRAST TECHNIQUE: Multidetector CT imaging of the abdomen and pelvis was performed using the standard protocol following bolus administration of intravenous contrast. CONTRAST:  139mL OMNIPAQUE IOHEXOL 300 MG/ML  SOLN COMPARISON:  04/25/2013 FINDINGS: Lower Chest: No acute findings. Hepatobiliary:  No hepatic masses identified. Pancreas:  No mass or inflammatory changes. Spleen: Within normal limits in size and appearance. Adrenals/Urinary Tract: No masses identified. No evidence of hydronephrosis. Stomach/Bowel: Multiple mildly dilated and fluid-filled small bowel loops are  seen in the lower abdomen and pelvis, with transition  point noted in the left pelvis, suspicious for adhesion. Large amount of stool seen in the right colon. No mass or inflammatory process identified. No evidence of abscess. Vascular/Lymphatic: No pathologically enlarged lymph nodes. No abdominal aortic aneurysm. Reproductive: Prior hysterectomy noted. Adnexal regions are unremarkable in appearance. Other:  None. Musculoskeletal:  No suspicious bone lesions identified. IMPRESSION: Partial small bowel obstruction, with transition point in the left pelvis suspicious for adhesion. No mass, inflammatory process, or abscess identified. Electronically Signed   By: Marlaine Hind M.D.   On: 10/04/2018 04:54    Pending Labs [employer] (From admission, onward)    Start     Ordered   Signed and Held  HIV antibody (Routine Testing)  Once,   R     Signed and Held   Signed and Held  CBC  (enoxaparin (LOVENOX)    CrCl >/= 30 ml/min)  Once,   R    Comments: Baseline for enoxaparin therapy IF NOT ALREADY DRAWN.  Notify MD if PLT < 100 K.    Signed and Held   Signed and Held  Creatinine, serum  (enoxaparin (LOVENOX)    CrCl >/= 30 ml/min)  Once,   R    Comments: Baseline for enoxaparin therapy IF NOT ALREADY DRAWN.    Signed and Held   Signed and Held  Creatinine, serum  (enoxaparin (LOVENOX)    CrCl >/= 30 ml/min)  Weekly,   R    Comments: while on enoxaparin therapy    Signed and Held   Signed and Held  Basic metabolic panel  Tomorrow morning,   R     Signed and Held   Signed and Held  Magnesium  Tomorrow morning,   R     Signed and Held   Signed and Held  Phosphorus  Tomorrow morning,   R     Signed and Held   Signed and Held  CBC  Tomorrow morning,   R     Signed and Held          Vitals/Pain Today's Vitals   10/04/18 0600 10/04/18 0615 10/04/18 0630 10/04/18 0718  BP:    125/69  Pulse: 71 70 63 71  Resp:    20  Temp:      TempSrc:      SpO2: 99% 99% 96% 97%  Weight:      Height:       PainSc:    6     Isolation Precautions No active isolations  Medications Medications  ketorolac (TORADOL) 30 MG/ML injection 30 mg (30 mg Intravenous Given 10/04/18 0648)  ondansetron (ZOFRAN-ODT) disintegrating tablet 4 mg ( Oral See Alternative 10/04/18 0648)    Or  ondansetron (ZOFRAN) injection 4 mg (4 mg Intravenous Given 10/04/18 0648)  sodium chloride 0.9 % bolus 1,000 mL (0 mLs Intravenous Stopped 10/04/18 0430)  HYDROmorphone (DILAUDID) injection 1 mg (1 mg Intravenous Given 10/04/18 0303)  ondansetron (ZOFRAN) injection 4 mg (4 mg Intravenous Given 10/04/18 0304)  iohexol (OMNIPAQUE) 240 MG/ML injection 50 mL (50 mLs Oral Contrast Given 10/04/18 0308)  iohexol (OMNIPAQUE) 300 MG/ML solution 100 mL (100 mLs Intravenous Contrast Given 10/04/18 0349)  sodium chloride 0.9 % bolus 1,000 mL (1,000 mLs Intravenous New Bag/Given 10/04/18 0622)    Mobility walks Low fall risk   Focused Assessments Cardiac Assessment Handoff:    No results found for: CKTOTAL, CKMB, CKMBINDEX, TROPONINI No results found for: DDIMER Does the Patient currently have chest pain? No  R Recommendations: See Admitting Provider Note  Report given to:   Additional Notes:

## 2018-10-05 ENCOUNTER — Inpatient Hospital Stay: Payer: 59

## 2018-10-05 LAB — CBC
HCT: 36.9 % (ref 36.0–46.0)
Hemoglobin: 12 g/dL (ref 12.0–15.0)
MCH: 30.1 pg (ref 26.0–34.0)
MCHC: 32.5 g/dL (ref 30.0–36.0)
MCV: 92.5 fL (ref 80.0–100.0)
Platelets: 138 10*3/uL — ABNORMAL LOW (ref 150–400)
RBC: 3.99 MIL/uL (ref 3.87–5.11)
RDW: 13.6 % (ref 11.5–15.5)
WBC: 5.1 10*3/uL (ref 4.0–10.5)
nRBC: 0 % (ref 0.0–0.2)

## 2018-10-05 LAB — BASIC METABOLIC PANEL
Anion gap: 6 (ref 5–15)
BUN: 12 mg/dL (ref 6–20)
CO2: 24 mmol/L (ref 22–32)
Calcium: 8.3 mg/dL — ABNORMAL LOW (ref 8.9–10.3)
Chloride: 110 mmol/L (ref 98–111)
Creatinine, Ser: 0.93 mg/dL (ref 0.44–1.00)
GFR calc Af Amer: >60 mL/min (ref >60)
GFR calc non Af Amer: >60 mL/min (ref >60)
Glucose, Bld: 112 mg/dL — ABNORMAL HIGH (ref 70–99)
Potassium: 3.5 mmol/L (ref 3.5–5.1)
Sodium: 140 mmol/L (ref 135–145)

## 2018-10-05 LAB — PHOSPHORUS: Phosphorus: 2.6 mg/dL (ref 2.5–4.6)

## 2018-10-05 LAB — MAGNESIUM: Magnesium: 2 mg/dL (ref 1.7–2.4)

## 2018-10-05 MED ORDER — PANTOPRAZOLE SODIUM 40 MG PO TBEC
40.0000 mg | DELAYED_RELEASE_TABLET | Freq: Every day | ORAL | Status: DC
  Administered 2018-10-05: 40 mg via ORAL
  Filled 2018-10-05: qty 1

## 2018-10-05 NOTE — Progress Notes (Signed)
RN called to room, pt reports IV infiltrated. Swelling, redness around site noted. IV removed, warm/cold therapy initiated for comfort. Pt advised to elevate arm. Will continue to monitor.

## 2018-10-05 NOTE — Progress Notes (Signed)
Pt tolerating full liquids tray. No further nausea or pain per pt. Showered this afternoon, reports she 'feels much better'. Hopeful for D/C tomorrow.

## 2018-10-05 NOTE — Consult Note (Signed)
PHARMACIST - PHYSICIAN COMMUNICATION  DR:   Dahlia Byes  CONCERNING: IV to Oral Route Change Policy  RECOMMENDATION: This patient is receiving pantoprazole by the intravenous route.  Based on criteria approved by the Pharmacy and Therapeutics Committee, the intravenous medication(s) is/are being converted to the equivalent oral dose form(s).   DESCRIPTION: These criteria include:  The patient is eating (either orally or via tube) and/or has been taking other orally administered medications for a least 24 hours  The patient has no evidence of active gastrointestinal bleeding or impaired GI absorption (gastrectomy, short bowel, patient on TNA or NPO).  If you have questions about this conversion, please contact the Pharmacy Department  []   [phone] )  Susan Green [x]   [phone] )  [facility] []   [phone] )  [facility] []   [phone] )  [facility] []   [phone] )  [place]ath, PharmD, BCPS Clinical Pharmacist 10/05/2018 9:05 PM

## 2018-10-05 NOTE — Progress Notes (Signed)
Ileus Much improved + flatus KUB personally reviewed, nml gas pattern AVSS Labs ok NGT 275  PE NAD Abd: soft, nt, non distended. No peritonitis Ext: no edema  A/P resolving ileus Partial SBO Extensive d/w the pt and family DC NGT CLD NO surgical intervention   I spent over 35 min in this encounter w > 50% spent in coordination and counseling of her care

## 2018-10-05 NOTE — Progress Notes (Signed)
NG tube removed per order without difficulty. Pt tolerated well. Clear liquids tray delivered to bedside. Pt denies nausea.

## 2018-10-06 NOTE — Progress Notes (Signed)
Tolerated full liquid diet this morning without pain or nausea, passing gas. Up walking around unit x5 laps independently.   Advanced to regular diet. Pt updated on current plan of care.

## 2018-10-06 NOTE — Progress Notes (Signed)
Patient discharged home with husband. Discharge instructions given and reviewed with patient. Patient verbalized understanding. Escorted out by staff.

## 2018-10-06 NOTE — Discharge Summary (Signed)
[facility] SURGICAL DISCHARGE SUMMARY (cpt: [phone])  Patient ID: Susan Green MRN: [id number] DOB/AGE: 1960-05-10 58 y.o.  Admit date: 10/04/2018 Discharge date: 10/06/2018  Discharge Diagnoses Patient Active Problem List   Diagnosis Date Noted  . SBO (small bowel obstruction) ([place]) 10/04/2018    08/24/2016    05/09/2016    Consultants None  Procedures None  HPI: Susan Green is a 58 y.o. female since to the emergency room complaining of lower abdominal pain that started last night.  The onset was acute and intensity was severe and sharp in nature.  This was a nonradiating pain.  She also reports some nausea.  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  She did have a history of appendectomy and cecopexy by Dr. Jamal Collin several years ago.  The emergency room she did have nausea and vomiting and she attributed to the Dilaudid that she received.  CT scan personally reviewed there is evidence of partial small bowel obstruction with mildly dilated loops of small bowel.  No free air no closed-loop obstruction.  CBC is normal CMP shows an elevated BUN of 24 with a slight increase in creatinine of 1.01.  She is able to perform more than 4 METS of activity without any shortness of breath or chest pain.  She did have a history of breast cancer on the right side status post mastectomy with immediate reconstruction on the left with prosthesis for symmetry.  Did receive chemotherapy as well. 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  Hospital Course: Patient was admitted to the general surgery service and NGT was placed for decompression. NGT was removed on HD1 after she began to have flatus and improvement/resolution of her abdominal pain. Advancement of patient's diet and ambulation were well-tolerated. The remainder of patient's hospital course was  essentially unremarkable, and discharge planning was initiated accordingly with patient safely able to be discharged home with appropriate discharge instructions and outpatient follow-up after all of her questions were answered to her expressed satisfaction.   Discharge Condition: Good   Physical Examination:  Constitutional: Well appearing female, NAD Pulmonary: Normal effort, no respiratory distress Gastrointestinal: Soft, non-tender, non-distended, dull to percussion, no rebound/guarding Skin: warm, dry   Allergies as of 10/06/2018      Reactions   Dilaudid [hydromorphone Hcl] Nausea And Vomiting   Alendronate    Other reaction(s): Other (See Comments) alopecia   Codeine Nausea And Vomiting   Codeine    Demerol [meperidine] Nausea Only   Demerol [meperidine]       Medication List    TAKE these medications   b complex vitamins tablet Take 2 tablets by mouth daily.   Biotin 5000 MCG Tabs Take 1 tablet by mouth.   CALCIUM & VIT D3 BONE HEALTH PO Take 1,200 mg by mouth daily.   CALCIUM-MAGNESIUM PO Take 1 tablet by mouth.   Calcium-Vitamin D 600-200 MG-UNIT tablet Take 1 tablet by mouth daily.   Flax Seed Oil 1300 MG Caps Take 2 capsules by mouth daily with lunch.   Hair Skin and Nails Formula Tabs Take 1 tablet by mouth.   levothyroxine 100 MCG tablet Commonly known as: SYNTHROID Take 1 tablet by mouth daily.   multivitamin capsule Take 1 capsule by mouth daily.   naproxen sodium 550 MG tablet Commonly known as: ANAPROX Take 550 mg by mouth  2 (two) times daily.   Otezla 30 MG Tabs Generic drug: Apremilast Take 1 tablet by mouth daily.   rosuvastatin 5 MG tablet Commonly known as: CRESTOR Take 0.5 tablets by mouth daily.   vitamin E 400 UNIT capsule Take 400 Units by mouth daily.        Follow-up Information    Pabon, [place] F, MD. Schedule an appointment as soon as possible for a visit in 1 month(s).   Specialty: General Surgery Why: small  bowel obstruction Contact information: [address] [place] [address] [phone]            Time spent on discharge management including discussion of hospital course, clinical condition, outpatient instructions, prescriptions, and follow up with the patient and members of the medical team: >30 minutes  -- Edison Simon , PA-C [facility]  10/06/2018, 10:40 AM [phone] M-F: 7am - 4pm

## 2018-10-07 ENCOUNTER — Ambulatory Visit: Payer: 59

## 2018-10-07 ENCOUNTER — Other Ambulatory Visit: Payer: 59

## 2018-10-07 LAB — HIV ANTIBODY (ROUTINE TESTING W REFLEX): HIV Screen 4th Generation wRfx: NONREACTIVE

## 2018-11-04 ENCOUNTER — Ambulatory Visit
Admission: RE | Admit: 2018-11-04 | Discharge: 2018-11-04 | Disposition: A | Discharge status: Home / Self Care | Payer: 59 | Source: Ambulatory Visit | Attending: Internal Medicine | Admitting: Internal Medicine

## 2018-11-04 DIAGNOSIS — Z170 Estrogen receptor positive status [ER+]: Secondary | ICD-10-CM | POA: Diagnosis present

## 2018-11-04 DIAGNOSIS — C50811 Malignant neoplasm of overlapping sites of right female breast: Secondary | ICD-10-CM

## 2018-11-05 ENCOUNTER — Encounter: Payer: Self-pay | Admitting: Surgery

## 2018-11-05 ENCOUNTER — Ambulatory Visit (INDEPENDENT_AMBULATORY_CARE_PROVIDER_SITE_OTHER): Payer: 59 | Admitting: Surgery

## 2018-11-05 ENCOUNTER — Other Ambulatory Visit: Payer: Self-pay

## 2018-11-05 VITALS — BP 118/79 | HR 62 | Temp 97.2°F | Ht 63.0 in | Wt 130.6 lb

## 2018-11-05 DIAGNOSIS — Z859 Personal history of malignant neoplasm, unspecified: Secondary | ICD-10-CM

## 2018-11-05 NOTE — Patient Instructions (Signed)
We sent the referral to [place]. Someone from their office will contact you to schedule an appointment. If you have not heard from anyone please call so we can check on this for you.    Please call our office if you have questions or concerns.

## 2018-11-06 ENCOUNTER — Telehealth: Payer: Self-pay | Admitting: *Deleted

## 2018-11-06 NOTE — Telephone Encounter (Signed)
Unable to leave vm as patient's mail box is full.

## 2018-11-06 NOTE — Telephone Encounter (Signed)
Patient contacted with bone density results. She would like a PA first for the Prolia/Reclast prior to committing to these injections. Could you determine what the insurance will pay for?

## 2018-11-06 NOTE — Telephone Encounter (Signed)
-----   Message from Cammie Sickle, MD sent at 11/06/2018  9:48 AM EDT ----- H- Please inform patient that her bone density shows osteopenia/which is similar to her bone density in 2018.  Continue calcium plus vitamin D; for now.  If interested let us know regarding Prolia/Reclast injections. Thank GB

## 2018-11-07 ENCOUNTER — Encounter: Payer: Self-pay | Admitting: Surgery

## 2018-11-07 NOTE — Progress Notes (Signed)
Outpatient Surgical Follow Up  11/07/2018  Susan Green is an 58 y.o. female.   Chief Complaint  Patient presents with  . Follow-up    HPI: Susan Green is a 58 year old female well-known to me with prior history of small bowel obstruction that was managed medically.  She does have a history of breast cancer status post mastectomy 2010 and reconstruction on the right side.Susan Green did have a mammogram that have personally reviewed showing no evidence of malignancy. She is doing much better from a GI perspective.  Tolerating diet.  Having bowel movement.  No fevers no chills no nausea or vomiting.  Past Medical History:  Diagnosis Date  . 174.9 2011   Right breast T1c,N1 (mic),ER 90%; PR 90%, Her 2 neu not over expressed treated with chemotherapy and reconstruction  . Breast cancer ([place]) 01/2009   Right with Chemo  . Cancer ([place])   . Personal history of chemotherapy 2010   BREAST CA  . PONV (postoperative nausea and vomiting)    very naseated  . Psoriasis   . Thyroid disease    hypothyroidism    Past Surgical History:  Procedure Laterality Date  . ABDOMINAL HYSTERECTOMY  2011  . ABDOMINAL HYSTERECTOMY    . APPENDECTOMY  04/29/13  . APPENDECTOMY    . AUGMENTATION MAMMAPLASTY Bilateral 08/2009   Reconstruction  . BREAST BIOPSY Right 2010   Positive  . BREAST RECONSTRUCTION  2011, 2012   3  . BREAST RECONSTRUCTION Right 2011  . BREAST SURGERY Right 01/25/2009   mastectomy  . BREAST SURGERY Left 2011   made to match the right breast  . BREAST SURGERY    . cecopexy  04-29-13  . CRANIOTOMY FOR AVM  1998  . CRANIOTOMY FOR AVM    . DILATION AND CURETTAGE OF UTERUS  2008   polyp  . KNEE ARTHROSCOPY Right 2005  . KNEE ARTHROSCOPY Left 09/22/2014   Procedure: Left knee arthroscopy, partial medial menisectomy;  Surgeon: Dereck Leep, MD;  Location: [facility] ORS;  Service: Orthopedics;  Laterality: Left;  . KNEE ARTHROSCOPY Bilateral   . MASTECTOMY Right 2010   BREAST CA  . PORTACATH  PLACEMENT  2011  . TONSILLECTOMY AND ADENOIDECTOMY  1966    Family History  Problem Relation Age of Onset  . Healthy Mother   . Healthy Father   . Colon cancer Paternal Grandfather 8  . Breast cancer Maternal Aunt 75  . Cancer - Other Sister 76       Vulvar    Social History:  reports that she has never smoked. She has never used smokeless tobacco. She reports that she does not drink alcohol or use drugs.  Allergies:  Allergies  Allergen Reactions  . Dilaudid [Hydromorphone Hcl] Nausea And Vomiting  . Alendronate     Other reaction(s): Other (See Comments) alopecia  . Codeine Nausea And Vomiting  . Codeine   . Demerol [Meperidine] Nausea Only  . Demerol [Meperidine]     Medications reviewed.    ROS Full ROS performed and is otherwise negative other than what is stated in HPI   BP 118/79   Pulse 62   Temp (!) 97.2 F (36.2 C)   Ht 5\' 3"  (1.6 m)   Wt 130 lb 9.6 oz (59.2 kg)   SpO2 97%   BMI 23.13 kg/m   Physical Exam Vitals signs and nursing note reviewed. Exam conducted with a chaperone present.  Constitutional:      General: She is not  in acute distress.    Appearance: Normal appearance. She is normal weight.  Eyes:     General: No scleral icterus.       Right eye: No discharge.        Left eye: No discharge.  Neck:     Musculoskeletal: Normal range of motion and neck supple. No neck rigidity or muscular tenderness.  Cardiovascular:     Rate and Rhythm: Normal rate and regular rhythm.  Pulmonary:     Effort: Pulmonary effort is normal. No respiratory distress.     Breath sounds: Normal breath sounds. No stridor.     Comments: BREAST: Right postmastectomy scar with implant without evidence of new lesions.  Left breast with implant in place without any evidence of masses or chest wall lesions.  There is no evidence of lymphadenopathy. Abdominal:     General: Abdomen is flat. There is no distension.     Palpations: There is no mass.     Tenderness:  There is no abdominal tenderness. There is no guarding.     Hernia: No hernia is present.  Musculoskeletal: Normal range of motion.        General: No swelling.  Skin:    General: Skin is warm and dry.     Capillary Refill: Capillary refill takes less than 2 seconds.  Neurological:     General: No focal deficit present.     Mental Status: She is alert and oriented to person, place, and time.  Psychiatric:        Mood and Affect: Mood normal.        Behavior: Behavior normal.        Thought Content: Thought content normal.        Judgment: Judgment normal.       Assessment/Plan: 58 year old female with also resolved bowel obstruction and breast cancer.  She does have family history of colon cancer and her last colonoscopy was 7 years ago.  Given new intra-abdominal findings of bowel obstruction prior history of cancer in her family I do think that it is prudent to proceed with a screening colonoscopy.  We will set her up for that through [place].  Regarding her breast care she will need another mammogram and physical exam in 1 year.  Tensive counseling provided  Greater than 50% of the 40 minutes  visit was spent in counseling/coordination of care   Caroleen Hamman, MD [facility]

## 2018-11-14 ENCOUNTER — Ambulatory Visit (INDEPENDENT_AMBULATORY_CARE_PROVIDER_SITE_OTHER): Payer: 59 | Admitting: Obstetrics and Gynecology

## 2018-11-14 ENCOUNTER — Other Ambulatory Visit (HOSPITAL_COMMUNITY)
Admission: RE | Admit: 2018-11-14 | Discharge: 2018-11-14 | Disposition: A | Discharge status: Home / Self Care | Payer: 59 | Source: Ambulatory Visit | Attending: Obstetrics and Gynecology | Admitting: Obstetrics and Gynecology

## 2018-11-14 ENCOUNTER — Other Ambulatory Visit: Payer: Self-pay

## 2018-11-14 ENCOUNTER — Encounter: Payer: Self-pay | Admitting: Obstetrics and Gynecology

## 2018-11-14 VITALS — BP 100/60 | Ht 63.0 in | Wt 133.0 lb

## 2018-11-14 DIAGNOSIS — Z01419 Encounter for gynecological examination (general) (routine) without abnormal findings: Secondary | ICD-10-CM | POA: Insufficient documentation

## 2018-11-14 DIAGNOSIS — Z124 Encounter for screening for malignant neoplasm of cervix: Secondary | ICD-10-CM

## 2018-11-14 DIAGNOSIS — Z1339 Encounter for screening examination for other mental health and behavioral disorders: Secondary | ICD-10-CM

## 2018-11-14 DIAGNOSIS — Z1331 Encounter for screening for depression: Secondary | ICD-10-CM

## 2018-11-14 NOTE — Progress Notes (Signed)
Routine Annual Gynecology Examination   PCP: Derinda Late, MD  Chief Complaint  Patient presents with  . Gynecologic Exam   History of Present Illness: Patient is a 58 y.o. No obstetric history on file. presents for annual exam. The patient has no complaints today.   Menopausal bleeding: denies  Menopausal symptoms: denies  Breast symptoms: denies.  She believes it was estrogen receptor positive.    Last pap smear: 2016 years ago.  Result Normal  Last mammogram: 10 days ago.  Result Normal   Last Colonoscopy: 6.5 years ago.  Due per Dr. Dahlia Byes.    Past Medical History:  Diagnosis Date  . 174.9 2011   Right breast T1c,N1 (mic),ER 90%; PR 90%, Her 2 neu not over expressed treated with chemotherapy and reconstruction  . Breast cancer ([place]) 01/2009   Right with Chemo  . Cancer ([place])   . Personal history of chemotherapy 2010   BREAST CA  . PONV (postoperative nausea and vomiting)    very naseated  . Psoriasis   . Thyroid disease    hypothyroidism   Past Surgical History:  Procedure Laterality Date  . APPENDECTOMY  04/29/13  . APPENDECTOMY    . AUGMENTATION MAMMAPLASTY Bilateral 08/2009   Reconstruction  . BREAST BIOPSY Right 2010   Positive  . BREAST RECONSTRUCTION  2011, 2012   3  . BREAST RECONSTRUCTION Right 2011  . BREAST SURGERY Right 01/25/2009   mastectomy  . BREAST SURGERY Left 2011   made to match the right breast  . BREAST SURGERY    . cecopexy  04-29-13  . CRANIOTOMY FOR AVM  1998  . DILATION AND CURETTAGE OF UTERUS  2008   polyp  . KNEE ARTHROSCOPY Right 2005  . KNEE ARTHROSCOPY Left 09/22/2014   Procedure: Left knee arthroscopy, partial medial menisectomy;  Surgeon: Dereck Leep, MD;  Location: [facility] ORS;  Service: Orthopedics;  Laterality: Left;  . KNEE ARTHROSCOPY Bilateral   . MASTECTOMY Right 2010   BREAST CA  . PORTACATH PLACEMENT  2011  . TONSILLECTOMY AND ADENOIDECTOMY  1966  . TOTAL LAPAROSCOPIC HYSTERECTOMY WITH BILATERAL SALPINGO  OOPHORECTOMY     Prior to Admission medications   Medication Sig Start Date End Date Taking? Authorizing Provider  Apremilast (OTEZLA) 30 MG TABS Take 1 tablet by mouth daily.    Yes [provider]  b complex vitamins tablet Take 2 tablets by mouth daily.   Yes [provider]  Biotin 5000 MCG TABS Take 1 tablet by mouth.   Yes [provider]  CALCIUM-MAGNESIUM PO Take 1 tablet by mouth.   Yes [provider]  Calcium-Vitamin D 600-200 MG-UNIT per tablet Take 1 tablet by mouth daily.    Yes [provider]  Flaxseed, Linseed, (FLAX SEED OIL) 1300 MG CAPS Take 2 capsules by mouth daily with lunch.   Yes [provider]  levothyroxine (SYNTHROID) 88 MCG tablet Alternates 88 mcg one day, 100 mcg the next 11/07/18  Yes [provider]  levothyroxine (SYNTHROID, LEVOTHROID) 100 MCG tablet Take 1 tablet by mouth daily. 10/10/12  Yes [provider]  Multiple Minerals-Vitamins (CALCIUM & VIT D3 BONE HEALTH PO) Take 1,200 mg by mouth daily.   Yes [provider]  Multiple Vitamin (MULTIVITAMIN) capsule Take 1 capsule by mouth daily.   Yes [provider]  rosuvastatin (CRESTOR) 5 MG tablet Take 0.5 tablets by mouth daily. 05/23/18  Yes [provider]  vitamin E 400 UNIT capsule Take 400  Units by mouth daily.   Yes [provider]    Allergies  Allergen Reactions  . Dilaudid [Hydromorphone Hcl] Nausea And Vomiting  . Alendronate     Other reaction(s): Other (See Comments) alopecia  . Codeine Nausea And Vomiting  . Codeine   . Demerol [Meperidine] Nausea Only  . Demerol [Meperidine]     Social History   Socioeconomic History  . Marital status: Married    Spouse name: Not on file  . Number of children: Not on file  . Years of education: Not on file  . Highest education level: Not on file  Occupational History  . Not on file  Social Needs  . Financial resource strain: Not on file   . Food insecurity    Worry: Not on file    Inability: Not on file  . Transportation needs    Medical: Not on file    Non-medical: Not on file  Tobacco Use  . Smoking status: Never Smoker  . Smokeless tobacco: Never Used  Substance and Sexual Activity  . Alcohol use: Never    Frequency: Never  . Drug use: Never  . Sexual activity: Yes    Birth control/protection: Surgical    Comment: Hysterectomy  Lifestyle  . Physical activity    Days per week: Not on file    Minutes per session: Not on file  . Stress: Not on file  Relationships  . Social Herbalist on phone: Not on file    Gets together: Not on file    Attends religious service: Not on file    Active member of club or organization: Not on file    Attends meetings of clubs or organizations: Not on file    Relationship status: Not on file  . Intimate partner violence    Fear of current or ex partner: Not on file    Emotionally abused: Not on file    Physically abused: Not on file    Forced sexual activity: Not on file  Other Topics Concern  . Not on file  Social History Narrative   ** Merged History Encounter **        Family History  Problem Relation Age of Onset  . Healthy Mother   . Healthy Father   . Diabetes Father   . Heart disease Father   . Colon cancer Paternal Grandfather 43  . Heart disease Paternal Grandfather   . Breast cancer Maternal Aunt 75  . Cancer - Other Sister 17       Vulvar  . Hypertension Maternal Grandmother     Review of Systems  Constitutional: Negative.   HENT: Negative.   Eyes: Negative.   Respiratory: Negative.   Cardiovascular: Negative.   Gastrointestinal: Negative.   Genitourinary: Negative.   Musculoskeletal: Negative.   Skin: Negative.   Neurological: Negative.   Psychiatric/Behavioral: Negative.      Physical Exam Vitals: BP 100/60   Ht 5\' 3"  (1.6 m)   Wt 133 lb (60.3 kg)   BMI 23.56 kg/m   Physical Exam Constitutional:      General: She is not  in acute distress.    Appearance: Normal appearance. She is well-developed.  Genitourinary:     Pelvic exam was performed with patient in the lithotomy position.     Vulva, urethra and bladder normal.     No inguinal adenopathy present in the right or left side.    No signs of injury in the vagina.  No vaginal discharge, erythema, tenderness or bleeding.     Cervical exam comments: Small stump likely present.     Uterus is absent.     No right or left adnexal mass present.     Right adnexa not tender or full.     Left adnexa not tender or full.  HENT:     Head: Normocephalic and atraumatic.  Eyes:     General: No scleral icterus.    Conjunctiva/sclera: Conjunctivae normal.  Neck:     Musculoskeletal: Normal range of motion and neck supple.     Thyroid: No thyromegaly.  Cardiovascular:     Rate and Rhythm: Normal rate and regular rhythm.     Heart sounds: No murmur. No friction rub. No gallop.   Pulmonary:     Effort: Pulmonary effort is normal. No respiratory distress.     Breath sounds: Normal breath sounds. No wheezing or rales.  Abdominal:     General: Bowel sounds are normal. There is no distension.     Palpations: Abdomen is soft. There is no mass.     Tenderness: There is no abdominal tenderness. There is no guarding or rebound.  Musculoskeletal: Normal range of motion.        General: No swelling or tenderness.  Lymphadenopathy:     Cervical: No cervical adenopathy.     Lower Body: No right inguinal adenopathy. No left inguinal adenopathy.  Neurological:     General: No focal deficit present.     Mental Status: She is alert and oriented to person, place, and time.     Cranial Nerves: No cranial nerve deficit.  Skin:    General: Skin is warm and dry.     Findings: No erythema or rash.  Psychiatric:        Mood and Affect: Mood normal.        Behavior: Behavior normal.        Judgment: Judgment normal.      Female chaperone present for pelvic and breast   portions of the physical exam  Results: AUDIT Questionnaire (screen for alcoholism): 1 PHQ-9: 1   Assessment and Plan:  58 y.o. No obstetric history on file. female here for routine annual gynecologic examination  Plan: Problem List Items Addressed This Visit    None    Visit Diagnoses    Women's annual routine gynecological examination    -  Primary   Relevant Orders   Cytology - PAP   Screening for depression       Screening for alcoholism       Pap smear for cervical cancer screening       Relevant Orders   Cytology - PAP     Screening: -- Blood pressure screen normal -- Colonoscopy - due - managed by PCP -- Mammogram - not due -- Weight screening: normal -- Depression screening negative (PHQ-9) -- Nutrition: normal -- cholesterol screening: per PCP -- osteoporosis screening: not due -- tobacco screening: not using -- alcohol screening: AUDIT questionnaire indicates low-risk usage. -- family history of breast cancer screening: done. not at high risk. -- no evidence of domestic violence or intimate partner violence. -- STD screening: gonorrhea/chlamydia NAAT not collected per patient request. -- pap smear not collected per ASCCP guidelines -- flu vaccine will receive at work -- HPV vaccination series: not eligilbe  Discuss with Dr. Rogue Bussing re: topical estrogen for symptoms of atrophy.  She requests communication via cell phone.   Prentice Docker, MD 11/14/2018 2:16 PM

## 2018-11-18 LAB — CYTOLOGY - PAP
Diagnosis: NEGATIVE
High risk HPV: NEGATIVE
Molecular Disclaimer: 56
Molecular Disclaimer: NORMAL

## 2018-12-03 ENCOUNTER — Encounter: Payer: Self-pay | Admitting: *Deleted

## 2018-12-08 ENCOUNTER — Other Ambulatory Visit: Payer: Self-pay | Admitting: Obstetrics and Gynecology

## 2018-12-08 DIAGNOSIS — N952 Postmenopausal atrophic vaginitis: Secondary | ICD-10-CM

## 2018-12-08 MED ORDER — ESTROGENS, CONJUGATED 0.625 MG/GM VA CREA: 1.0000 | TOPICAL_CREAM | VAGINAL | 3 refills | Status: AC

## 2019-02-06 NOTE — Telephone Encounter (Signed)
Msg. Fwd to Herminie on behalf of LuAnn. Previous conversation with Otho Perl is that patient's insurance may require iv zometa. Sent msg to Brandi to confirm any updates on pt's insurance plan.

## 2019-02-06 NOTE — Telephone Encounter (Signed)
Any updates on pt's prolia?  GB

## 2019-02-11 ENCOUNTER — Other Ambulatory Visit: Payer: Self-pay | Admitting: Internal Medicine

## 2019-02-11 DIAGNOSIS — M85851 Other specified disorders of bone density and structure, right thigh: Secondary | ICD-10-CM | POA: Insufficient documentation

## 2019-02-17 ENCOUNTER — Telehealth: Payer: Self-pay | Admitting: *Deleted

## 2019-02-17 ENCOUNTER — Other Ambulatory Visit: Payer: Self-pay | Admitting: Internal Medicine

## 2019-02-17 NOTE — Telephone Encounter (Signed)
Spoke with patient. Pt informed that insurance will only approve zometa in front line infusion for osteoporosis prevention. Per md this would be only a yearly infusion. Pt would like to think about this option and will contact our office back with her infusion. Next apt not until august 2021

## 2019-02-17 NOTE — Progress Notes (Signed)
Zometa orders are in.

## 2019-09-21 ENCOUNTER — Other Ambulatory Visit: Payer: 59

## 2019-09-21 ENCOUNTER — Other Ambulatory Visit: Payer: Self-pay

## 2019-09-21 ENCOUNTER — Ambulatory Visit: Payer: 59 | Admitting: Internal Medicine

## 2019-09-21 DIAGNOSIS — C50811 Malignant neoplasm of overlapping sites of right female breast: Secondary | ICD-10-CM

## 2019-09-22 ENCOUNTER — Inpatient Hospital Stay: Payer: 59 | Attending: Internal Medicine

## 2019-09-22 ENCOUNTER — Encounter: Payer: Self-pay | Admitting: Internal Medicine

## 2019-09-22 ENCOUNTER — Inpatient Hospital Stay (HOSPITAL_BASED_OUTPATIENT_CLINIC_OR_DEPARTMENT_OTHER): Payer: 59 | Admitting: Internal Medicine

## 2019-09-22 ENCOUNTER — Other Ambulatory Visit: Payer: Self-pay

## 2019-09-22 DIAGNOSIS — N1831 Chronic kidney disease, stage 3a: Secondary | ICD-10-CM | POA: Insufficient documentation

## 2019-09-22 DIAGNOSIS — C50811 Malignant neoplasm of overlapping sites of right female breast: Secondary | ICD-10-CM | POA: Insufficient documentation

## 2019-09-22 DIAGNOSIS — Z170 Estrogen receptor positive status [ER+]: Secondary | ICD-10-CM | POA: Diagnosis not present

## 2019-09-22 LAB — COMPREHENSIVE METABOLIC PANEL
ALT: 19 U/L (ref 0–44)
AST: 27 U/L (ref 15–41)
Albumin: 4.3 g/dL (ref 3.5–5.0)
Alkaline Phosphatase: 51 U/L (ref 38–126)
Anion gap: 9 (ref 5–15)
BUN: 21 mg/dL — ABNORMAL HIGH (ref 6–20)
CO2: 29 mmol/L (ref 22–32)
Calcium: 9.1 mg/dL (ref 8.9–10.3)
Chloride: 98 mmol/L (ref 98–111)
Creatinine, Ser: 0.82 mg/dL (ref 0.44–1.00)
GFR calc Af Amer: >60 mL/min (ref >60)
GFR calc non Af Amer: >60 mL/min (ref >60)
Glucose, Bld: 102 mg/dL — ABNORMAL HIGH (ref 70–99)
Potassium: 3.8 mmol/L (ref 3.5–5.1)
Sodium: 136 mmol/L (ref 135–145)
Total Bilirubin: 0.8 mg/dL (ref 0.3–1.2)
Total Protein: 7.1 g/dL (ref 6.5–8.1)

## 2019-09-22 LAB — CBC WITH DIFFERENTIAL/PLATELET
Abs Immature Granulocytes: 0.01 10*3/uL (ref 0.00–0.07)
Basophils Absolute: 0 10*3/uL (ref 0.0–0.1)
Basophils Relative: 1 %
Eosinophils Absolute: 0.2 10*3/uL (ref 0.0–0.5)
Eosinophils Relative: 3 %
HCT: 44.5 % (ref 36.0–46.0)
Hemoglobin: 14.9 g/dL (ref 12.0–15.0)
Immature Granulocytes: 0 %
Lymphocytes Relative: 20 %
Lymphs Abs: 1 10*3/uL (ref 0.7–4.0)
MCH: 30.7 pg (ref 26.0–34.0)
MCHC: 33.5 g/dL (ref 30.0–36.0)
MCV: 91.6 fL (ref 80.0–100.0)
Monocytes Absolute: 0.5 10*3/uL (ref 0.1–1.0)
Monocytes Relative: 10 %
Neutro Abs: 3.5 10*3/uL (ref 1.7–7.7)
Neutrophils Relative %: 66 %
Platelets: 193 10*3/uL (ref 150–400)
RBC: 4.86 MIL/uL (ref 3.87–5.11)
RDW: 12.8 % (ref 11.5–15.5)
WBC: 5.2 10*3/uL (ref 4.0–10.5)
nRBC: 0 % (ref 0.0–0.2)

## 2019-09-22 NOTE — Patient Instructions (Signed)
Please call us back re: your decision re: Zometa/reclast injection.

## 2019-09-22 NOTE — Progress Notes (Signed)
[place] OFFICE PROGRESS NOTE  Patient Care Team: Derinda Late, MD as PCP - General (Family Medicine) Bary Castilla, Forest Gleason, MD as Consulting Physician (General Surgery) Derinda Late, MD (Family Medicine)  Cancer Staging No matching staging information was found for the patient.   Oncology History Overview Note  carcinoma of breast, diagnosis.  In February of 2011.  Right breast.  ER positive.  PR positive, HER-2/neu, and not overexpressed s/p Right mastec  Status post 4 cycles of chemotherapy with Cytoxan, and Taxotere Finished chemotherapy on 07/07/2009  Status post bilateral   oopherectomy  and  hysterectomy in February of 2012   Tamoxifen June of 2011   # Genetcis- delcines  # Osteopenia- hair loss sec to Foaomax   Carcinoma of overlapping 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, estrogen receptor positive ([place])  08/24/2016 Initial Diagnosis   Carcinoma of overlapping 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, estrogen receptor positive ([place])       INTERVAL HISTORY:  Susan Green 59 y.o.  female pleasant patient above history of breast cancer is here for follow-up.   In the interim patient was admitted to hospital for partial small bowel obstruction thought to be from scar tissue.  Resolved conservatively.  Patient denies any new lumps or bumps.  Her appetite is good.  No weight loss.  She continues to be physically active running regularly.  Patient is under significant stress given the loss of her coworker friend to breast cancer.  She is also her parents caregiver.   Review of Systems  Constitutional: Negative for chills, diaphoresis, fever, malaise/fatigue and weight loss.  HENT: Negative for nosebleeds and sore throat.   Eyes: Negative for double vision.  Respiratory: Negative for cough, hemoptysis, sputum production, shortness of breath and wheezing.   Cardiovascular: Negative for chest pain, palpitations, orthopnea and leg swelling.  Gastrointestinal:  Negative for abdominal pain, blood in stool, constipation, diarrhea, heartburn, melena, nausea and vomiting.  Genitourinary: Negative for dysuria, frequency and urgency.  Musculoskeletal: Negative for back pain and joint pain.  Skin: Negative.  Negative for itching and rash.  Neurological: Negative for dizziness, tingling, focal weakness, weakness and headaches.  Endo/Heme/Allergies: Does not bruise/bleed easily.  Psychiatric/Behavioral: Negative for depression. The patient is not nervous/anxious and does not have insomnia.       PAST MEDICAL HISTORY :  Past Medical History:  Diagnosis Date  . 174.9 2011   Right breast T1c,N1 (mic),ER 90%; PR 90%, Her 2 neu not over expressed treated with chemotherapy and reconstruction  . Breast cancer ([place]) 01/2009   Right with Chemo  . Cancer ([place])   . Personal history of chemotherapy 2010   BREAST CA  . PONV (postoperative nausea and vomiting)    very naseated  . Psoriasis   . Thyroid disease    hypothyroidism    PAST SURGICAL HISTORY :   Past Surgical History:  Procedure Laterality Date  . APPENDECTOMY  04/29/13  . APPENDECTOMY    . AUGMENTATION MAMMAPLASTY Bilateral 08/2009   Reconstruction  . BREAST BIOPSY Right 2010   Positive  . BREAST RECONSTRUCTION  2011, 2012   3  . BREAST RECONSTRUCTION Right 2011  . BREAST SURGERY Right 01/25/2009   mastectomy  . BREAST SURGERY Left 2011   made to match the right breast  . BREAST SURGERY    . cecopexy  04-29-13  . CRANIOTOMY FOR AVM  1998  . DILATION AND CURETTAGE OF UTERUS  2008   polyp  . KNEE  ARTHROSCOPY Right 2005  . KNEE ARTHROSCOPY Left 09/22/2014   Procedure: Left knee arthroscopy, partial medial menisectomy;  Surgeon: Dereck Leep, MD;  Location: [facility] ORS;  Service: Orthopedics;  Laterality: Left;  . KNEE ARTHROSCOPY Bilateral   . MASTECTOMY Right 2010   BREAST CA  . PORTACATH PLACEMENT  2011  . TONSILLECTOMY AND ADENOIDECTOMY  1966  . TOTAL LAPAROSCOPIC HYSTERECTOMY WITH  BILATERAL SALPINGO OOPHORECTOMY      FAMILY HISTORY :   Family History  Problem Relation Age of Onset  . Healthy Mother   . Healthy Father   . Diabetes Father   . Heart disease Father   . Colon cancer Paternal Grandfather 71  . Heart disease Paternal Grandfather   . Breast cancer Maternal Aunt 75  . Cancer - Other Sister 56       Vulvar  . Hypertension Maternal Grandmother     SOCIAL HISTORY:   Social History   Tobacco Use  . Smoking status: Never Smoker  . Smokeless tobacco: Never Used  Vaping Use  . Vaping Use: Never used  Substance Use Topics  . Alcohol use: Never  . Drug use: Never    ALLERGIES:  is allergic to dilaudid [hydromorphone hcl], alendronate, codeine, codeine, demerol [meperidine], and demerol [meperidine].  MEDICATIONS:  Current Outpatient Medications  Medication Sig Dispense Refill  . Apremilast (OTEZLA) 30 MG TABS Take 1 tablet by mouth daily.     Marland Kitchen b complex vitamins tablet Take 2 tablets by mouth daily.    . Biotin 5000 MCG TABS Take 1 tablet by mouth.    Marland Kitchen CALCIUM-MAGNESIUM PO Take 1 tablet by mouth.    . Calcium-Vitamin D 600-200 MG-UNIT per tablet Take 1 tablet by mouth daily.     Marland Kitchen conjugated estrogens (PREMARIN) vaginal cream Place 1 Applicatorful vaginally 2 (two) times a week. 1 gram vaginally at bedtime twice weekly 30 g 3  . Flaxseed, Linseed, (FLAX SEED OIL) 1300 MG CAPS Take 2 capsules by mouth daily with lunch.    . levothyroxine (SYNTHROID) 88 MCG tablet Take by mouth. Mon, Wed, Fri    . levothyroxine (SYNTHROID, LEVOTHROID) 100 MCG tablet Take 1 tablet by mouth daily. Sat, Sun, Tues, Thurs    . liothyronine (CYTOMEL) 5 MCG tablet 1/2 tab MWF. Take on an empty stomach with a glass of water at least 30-60 minutes before breakfast.    . Multiple Minerals-Vitamins (CALCIUM & VIT D3 BONE HEALTH PO) Take 1,200 mg by mouth daily.    . Multiple Vitamin (MULTIVITAMIN) capsule Take 1 capsule by mouth daily.    . rosuvastatin (CRESTOR) 5 MG  tablet Take 0.5 tablets by mouth daily.    . vitamin E 400 UNIT capsule Take 400 Units by mouth daily.     No current facility-administered medications for this visit.    PHYSICAL EXAMINATION: ECOG PERFORMANCE STATUS: 0 - Asymptomatic  BP (!) 154/83   Pulse (!) 56   Temp (!) 96.9 F (36.1 C) (Tympanic)   Resp 16   Ht '5\' 3"'  (1.6 m)   Wt 134 lb (60.8 kg)   SpO2 98%   BMI 23.74 kg/m   Filed Weights   09/22/19 0945  Weight: 134 lb (60.8 kg)    Physical Exam HENT:     Head: Normocephalic and atraumatic.     Mouth/Throat:     Pharynx: No oropharyngeal exudate.  Eyes:     Pupils: Pupils are equal, round, and reactive to light.  Cardiovascular:  Rate and Rhythm: Normal rate and regular rhythm.  Pulmonary:     Effort: No respiratory distress.     Breath sounds: No wheezing.  Abdominal:     General: Bowel sounds are normal. There is no distension.     Palpations: Abdomen is soft. There is no mass.     Tenderness: There is no abdominal tenderness. There is no guarding or rebound.  Musculoskeletal:        General: No tenderness. Normal range of motion.     Cervical back: Normal range of motion and neck supple.  Skin:    General: Skin is warm.     Comments: Breast exam  (in presence of nurse) right breast mastectomy ; implant-no lumps or bumps.  Left breast no unusual skin changes or dominant masses felt-implant in place   Neurological:     Mental Status: She is alert and oriented to person, place, and time.  Psychiatric:        Mood and Affect: Affect normal.    LABORATORY DATA:  I have reviewed the data as listed    Component Value Date/Time   NA 136 09/22/2019 0921   NA 140 01/27/2014 1538   K 3.8 09/22/2019 0921   K 3.7 01/27/2014 1538   CL 98 09/22/2019 0921   CL 103 01/27/2014 1538   CO2 29 09/22/2019 0921   CO2 34 (H) 01/27/2014 1538   GLUCOSE 102 (H) 09/22/2019 0921   GLUCOSE 140 (H) 01/27/2014 1538   BUN 21 (H) 09/22/2019 0921   BUN 18 01/27/2014  1538   CREATININE 0.82 09/22/2019 0921   CREATININE 1.02 01/27/2014 1538   CALCIUM 9.1 09/22/2019 0921   CALCIUM 8.4 (L) 01/27/2014 1538   PROT 7.1 09/22/2019 0921   PROT 6.7 01/27/2014 1538   ALBUMIN 4.3 09/22/2019 0921   ALBUMIN 3.4 01/27/2014 1538   AST 27 09/22/2019 0921   AST 22 01/27/2014 1538   ALT 19 09/22/2019 0921   ALT 22 01/27/2014 1538   ALKPHOS 51 09/22/2019 0921   ALKPHOS 49 01/27/2014 1538   BILITOT 0.8 09/22/2019 0921   BILITOT 0.3 01/27/2014 1538   GFRNONAA >60 09/22/2019 0921   GFRNONAA >60 01/27/2014 1538   GFRNONAA >60 05/22/2013 1212   GFRAA >60 09/22/2019 0921   GFRAA >60 01/27/2014 1538   GFRAA >60 05/22/2013 1212    No results found for: SPEP, UPEP  Lab Results  Component Value Date   WBC 5.2 09/22/2019   NEUTROABS 3.5 09/22/2019   HGB 14.9 09/22/2019   HCT 44.5 09/22/2019   MCV 91.6 09/22/2019   PLT 193 09/22/2019      Chemistry      Component Value Date/Time   NA 136 09/22/2019 0921   NA 140 01/27/2014 1538   K 3.8 09/22/2019 0921   K 3.7 01/27/2014 1538   CL 98 09/22/2019 0921   CL 103 01/27/2014 1538   CO2 29 09/22/2019 0921   CO2 34 (H) 01/27/2014 1538   BUN 21 (H) 09/22/2019 0921   BUN 18 01/27/2014 1538   CREATININE 0.82 09/22/2019 0921   CREATININE 1.02 01/27/2014 1538      Component Value Date/Time   CALCIUM 9.1 09/22/2019 0921   CALCIUM 8.4 (L) 01/27/2014 1538   ALKPHOS 51 09/22/2019 0921   ALKPHOS 49 01/27/2014 1538   AST 27 09/22/2019 0921   AST 22 01/27/2014 1538   ALT 19 09/22/2019 0921   ALT 22 01/27/2014 1538   BILITOT 0.8 09/22/2019 5449  BILITOT 0.3 01/27/2014 1538       RADIOGRAPHIC STUDIES: I have personally reviewed the radiological images as listed and agreed with the findings in the report. No results found.   ASSESSMENT & PLAN:  Carcinoma of overlapping 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, estrogen receptor positive ([place]) # STAGE I -right breast ER/PR positive, HER-2 not overexpressed.  Finished  tamoxifen sep 2017; under active surveillance.  STABLE.   #  No clinical evidence of recurrence. Needs mammogram/unilateral left-in September.   # Dexa Scan: Done in SEP 2020- T score -2.1. Continue calcium and vitamin D/exercise.  Again discussed patient risk factors for osteoporosis; and would recommend bisphosphonates.  Discussed the potential side effects including but not limited to infusion reaction; hypocalcemia and rare osteonecrosis of jaw.  # CKD-IIIA-as per the labs from PCPs office.  Our labs indicate normal renal function.  Recommend importance of hydration avoiding of NSAIDs.   # DISPOSITION: Patient will call regarding Zometa. # Left mammogram in September 2021- # follow up in 12 months; MD-labs- cbc/cmp ca-27-29.-Dr.B    Orders Placed This Encounter  Procedures  . MM 3D SCREEN BREAST W/IMPLANT UNI LEFT    Standing Status:   Future    Standing Expiration Date:   09/21/2020    Order Specific Question:   Reason for Exam (SYMPTOM  OR DIAGNOSIS REQUIRED)    Answer:   history of breast cancer    Order Specific Question:   Is the patient pregnant?    Answer:   No    Order Specific Question:   Preferred imaging location?    Answer:   [facility]  . Comprehensive metabolic panel    Standing Status:   Future    Standing Expiration Date:   03/24/2021  . CBC with Differential    Standing Status:   Future    Standing Expiration Date:   03/24/2021  . Cancer antigen 27.29    Standing Status:   Future    Standing Expiration Date:   03/24/2021   All questions were answered. The patient knows to call the clinic with any problems, questions or concerns.      Cammie Sickle, MD 09/22/2019 12:04 PM

## 2019-09-22 NOTE — Assessment & Plan Note (Addendum)
#  STAGE I -right breast ER/PR positive, HER-2 not overexpressed.  Finished tamoxifen sep 2017; under active surveillance.  STABLE.   #  No clinical evidence of recurrence. Needs mammogram/unilateral left-in September.   # Dexa Scan: Done in SEP 2020- T score -2.1. Continue calcium and vitamin D/exercise.  Again discussed patient risk factors for osteoporosis; and would recommend bisphosphonates.  Discussed the potential side effects including but not limited to infusion reaction; hypocalcemia and rare osteonecrosis of jaw.  # CKD-IIIA-as per the labs from PCPs office.  Our labs indicate normal renal function.  Recommend importance of hydration avoiding of NSAIDs.   # DISPOSITION: Patient will call regarding Zometa. # Left mammogram in September 2021- # follow up in 12 months; MD-labs- cbc/cmp ca-27-29.-Dr.B

## 2019-09-23 ENCOUNTER — Telehealth: Payer: Self-pay | Admitting: Internal Medicine

## 2019-09-23 LAB — CANCER ANTIGEN 27.29: CA 27.29: 21.8 U/mL (ref 0.0–38.6)

## 2019-09-23 NOTE — Telephone Encounter (Signed)
I tried to reach patient; mailbox full-  Please inform patient that her tumor marker is normal; continue follow-up as planned.   GB

## 2019-10-02 NOTE — Telephone Encounter (Signed)
Spoke with patient. Patient informed of results and plan of care. She has not yet decided on the reclast. She will call our office back when she has decided on proceeding with reclast

## 2019-11-05 ENCOUNTER — Other Ambulatory Visit: Payer: Self-pay

## 2019-11-05 ENCOUNTER — Ambulatory Visit
Admission: RE | Admit: 2019-11-05 | Discharge: 2019-11-05 | Disposition: A | Discharge status: Home / Self Care | Payer: 59 | Source: Ambulatory Visit | Attending: Internal Medicine | Admitting: Internal Medicine

## 2019-11-05 DIAGNOSIS — Z170 Estrogen receptor positive status [ER+]: Secondary | ICD-10-CM | POA: Insufficient documentation

## 2019-11-05 DIAGNOSIS — N6489 Other specified disorders of breast: Secondary | ICD-10-CM | POA: Insufficient documentation

## 2019-11-05 DIAGNOSIS — C50811 Malignant neoplasm of overlapping sites of right female breast: Secondary | ICD-10-CM | POA: Diagnosis present

## 2019-11-05 DIAGNOSIS — Z9882 Breast implant status: Secondary | ICD-10-CM | POA: Diagnosis not present

## 2019-11-05 DIAGNOSIS — Z1231 Encounter for screening mammogram for malignant neoplasm of breast: Secondary | ICD-10-CM | POA: Insufficient documentation

## 2019-11-09 ENCOUNTER — Other Ambulatory Visit: Payer: Self-pay | Admitting: Internal Medicine

## 2019-11-09 DIAGNOSIS — N6489 Other specified disorders of breast: Secondary | ICD-10-CM

## 2019-11-09 DIAGNOSIS — R928 Other abnormal and inconclusive findings on diagnostic imaging of breast: Secondary | ICD-10-CM

## 2019-11-10 ENCOUNTER — Telehealth: Payer: Self-pay | Admitting: Internal Medicine

## 2019-11-10 NOTE — Telephone Encounter (Signed)
On 9/20-spoke to patient regarding the recent asymmetry noted on the left breast.  Awaiting diagnostic/ultrasound.  Patient awaiting call from the breast center for further work-up.   Patient asked to call us back if she has not heard from the breast center by end of the week.

## 2019-11-10 NOTE — Telephone Encounter (Signed)
Susan Green, could you arrange for the mammo with breast center

## 2019-11-18 ENCOUNTER — Ambulatory Visit
Admission: RE | Admit: 2019-11-18 | Discharge: 2019-11-18 | Disposition: A | Discharge status: Home / Self Care | Payer: 59 | Source: Ambulatory Visit | Attending: Internal Medicine | Admitting: Internal Medicine

## 2019-11-18 DIAGNOSIS — N6489 Other specified disorders of breast: Secondary | ICD-10-CM | POA: Diagnosis present

## 2019-11-18 DIAGNOSIS — R928 Other abnormal and inconclusive findings on diagnostic imaging of breast: Secondary | ICD-10-CM | POA: Diagnosis present

## 2019-11-20 ENCOUNTER — Telehealth: Payer: Self-pay | Admitting: Internal Medicine

## 2019-11-20 NOTE — Telephone Encounter (Signed)
On 9/30-spoke to patient regarding results of the mammogram normal. Follow-up as planned.

## 2019-12-22 ENCOUNTER — Ambulatory Visit: Payer: 59 | Admitting: Obstetrics and Gynecology

## 2020-01-11 ENCOUNTER — Telehealth: Payer: Self-pay | Admitting: Internal Medicine

## 2020-01-11 ENCOUNTER — Telehealth: Payer: Self-pay | Admitting: *Deleted

## 2020-01-11 DIAGNOSIS — D751 Secondary polycythemia: Secondary | ICD-10-CM

## 2020-01-11 NOTE — Telephone Encounter (Signed)
On 11/22-spoke to patient regarding her concerns for elevated hemoglobin/hematocrit.  Recommend-checking labs [ordered]-please have them schedule this week.  Will call with results.

## 2020-01-11 NOTE — Telephone Encounter (Signed)
Patient called asking to speak with Lennox Grumbles, RN due to her H& H being too high  [place] 12/08/2019 Component 12/08/2019 06/02/2019 12/06/2017 05/31/2017          WBC (White Blood Cell Count) 6.9 5.2 7.4 5.0   RBC (Red Blood Cell Count) 5.41 5.32 5.08 4.97   Hemoglobin 16.5High   16.0High   15.2High   14.9   Hematocrit 51.0High   48.5High   46.7 44.9   MCV (Mean Corpuscular Volume) 94.3 91.2 91.9 90.3   MCH (Mean Corpuscular Hemoglobin) 30.5 30.1 29.9 30.0   MCHC (Mean Corpuscular Hemoglobin Concentration) 32.4 33.0 32.5 33.2   RDW-CV (Red Cell Distribution Width) 12.9 13.0 13.2 13.6   MPV (Mean Platelet Volume) 12.1 9.9 10.3 11.2   Immature Granulocyte Count 0.01 -- -- --

## 2020-01-11 NOTE — Telephone Encounter (Signed)
Dr. B stated that he would call the patient to discuss her concerns.

## 2020-01-12 ENCOUNTER — Inpatient Hospital Stay: Payer: 59 | Attending: Internal Medicine

## 2020-01-12 ENCOUNTER — Other Ambulatory Visit: Payer: Self-pay

## 2020-01-12 DIAGNOSIS — Z170 Estrogen receptor positive status [ER+]: Secondary | ICD-10-CM | POA: Insufficient documentation

## 2020-01-12 DIAGNOSIS — D751 Secondary polycythemia: Secondary | ICD-10-CM

## 2020-01-12 DIAGNOSIS — C50811 Malignant neoplasm of overlapping sites of right female breast: Secondary | ICD-10-CM | POA: Insufficient documentation

## 2020-01-12 LAB — COMPREHENSIVE METABOLIC PANEL
ALT: 20 U/L (ref 0–44)
AST: 27 U/L (ref 15–41)
Albumin: 4.3 g/dL (ref 3.5–5.0)
Alkaline Phosphatase: 42 U/L (ref 38–126)
Anion gap: 13 (ref 5–15)
BUN: 22 mg/dL — ABNORMAL HIGH (ref 6–20)
CO2: 28 mmol/L (ref 22–32)
Calcium: 9.6 mg/dL (ref 8.9–10.3)
Chloride: 98 mmol/L (ref 98–111)
Creatinine, Ser: 1.04 mg/dL — ABNORMAL HIGH (ref 0.44–1.00)
GFR, Estimated: >60 mL/min (ref >60)
Glucose, Bld: 91 mg/dL (ref 70–99)
Potassium: 4 mmol/L (ref 3.5–5.1)
Sodium: 139 mmol/L (ref 135–145)
Total Bilirubin: 0.6 mg/dL (ref 0.3–1.2)
Total Protein: 7.3 g/dL (ref 6.5–8.1)

## 2020-01-12 LAB — CBC WITH DIFFERENTIAL/PLATELET
Abs Immature Granulocytes: 0.01 10*3/uL (ref 0.00–0.07)
Basophils Absolute: 0 10*3/uL (ref 0.0–0.1)
Basophils Relative: 1 %
Eosinophils Absolute: 0.2 10*3/uL (ref 0.0–0.5)
Eosinophils Relative: 3 %
HCT: 45.1 % (ref 36.0–46.0)
Hemoglobin: 15.1 g/dL — ABNORMAL HIGH (ref 12.0–15.0)
Immature Granulocytes: 0 %
Lymphocytes Relative: 16 %
Lymphs Abs: 1.2 10*3/uL (ref 0.7–4.0)
MCH: 30.4 pg (ref 26.0–34.0)
MCHC: 33.5 g/dL (ref 30.0–36.0)
MCV: 90.9 fL (ref 80.0–100.0)
Monocytes Absolute: 0.6 10*3/uL (ref 0.1–1.0)
Monocytes Relative: 8 %
Neutro Abs: 5.2 10*3/uL (ref 1.7–7.7)
Neutrophils Relative %: 72 %
Platelets: 208 10*3/uL (ref 150–400)
RBC: 4.96 MIL/uL (ref 3.87–5.11)
RDW: 13 % (ref 11.5–15.5)
WBC: 7.3 10*3/uL (ref 4.0–10.5)
nRBC: 0 % (ref 0.0–0.2)

## 2020-01-12 LAB — LACTATE DEHYDROGENASE: LDH: 158 U/L (ref 98–192)

## 2020-01-13 LAB — ERYTHROPOIETIN: Erythropoietin: 5.7 m[IU]/mL (ref 2.6–18.5)

## 2020-01-26 LAB — JAK2  V617F QUAL. WITH REFLEX TO EXON 12: Reflex:: 15

## 2020-01-26 LAB — JAK2 EXONS 12-15

## 2020-01-27 ENCOUNTER — Telehealth: Payer: Self-pay | Admitting: Internal Medicine

## 2020-01-27 NOTE — Telephone Encounter (Signed)
On 12/07- spoke to pt re: negative results of JAK- 2 testing. No new recommendations; follow up as planned. GB

## 2020-01-29 ENCOUNTER — Encounter: Payer: Self-pay | Admitting: Obstetrics and Gynecology

## 2020-01-29 ENCOUNTER — Other Ambulatory Visit: Payer: Self-pay

## 2020-01-29 ENCOUNTER — Ambulatory Visit (INDEPENDENT_AMBULATORY_CARE_PROVIDER_SITE_OTHER): Payer: 59 | Admitting: Obstetrics and Gynecology

## 2020-01-29 VITALS — BP 112/70 | Ht 63.5 in | Wt 136.0 lb

## 2020-01-29 DIAGNOSIS — Z01419 Encounter for gynecological examination (general) (routine) without abnormal findings: Secondary | ICD-10-CM

## 2020-01-29 NOTE — Progress Notes (Signed)
Routine Annual Gynecology Examination   PCP: Derinda Late, MD  Chief Complaint  Patient presents with  . Annual Exam   History of Present Illness: Patient is a 59 y.o. presents for annual exam. The patient has no complaints today.   Menopausal bleeding: denies  Menopausal symptoms: denies  Breast symptoms: denies.  She believes it was estrogen receptor positive.    Last pap smear: 1 year ago.  Result Normal  Last mammogram: 2 months ago.  Result Normal   Last Colonoscopy: 7.5 years ago.  Due per Dr. Dahlia Byes.    She had a small bowel obstruction last year.    Past Medical History:  Diagnosis Date  . 174.9 2011   Right breast T1c,N1 (mic),ER 90%; PR 90%, Her 2 neu not over expressed treated with chemotherapy and reconstruction  . Breast cancer ([place]) 01/2009   Right with Chemo  . Cancer ([place])   . Personal history of chemotherapy 2010   BREAST CA  . PONV (postoperative nausea and vomiting)    very naseated  . Psoriasis   . Thyroid disease    hypothyroidism   Past Surgical History:  Procedure Laterality Date  . APPENDECTOMY  04/29/13  . APPENDECTOMY    . AUGMENTATION MAMMAPLASTY Bilateral 08/2009   Reconstruction  . BREAST BIOPSY Right 2010   Positive  . BREAST RECONSTRUCTION  2011, 2012   3  . BREAST RECONSTRUCTION Right 2011  . BREAST SURGERY Right 01/25/2009   mastectomy  . BREAST SURGERY Left 2011   made to match the right breast  . BREAST SURGERY    . cecopexy  04-29-13  . CRANIOTOMY FOR AVM  1998  . DILATION AND CURETTAGE OF UTERUS  2008   polyp  . KNEE ARTHROSCOPY Right 2005  . KNEE ARTHROSCOPY Left 09/22/2014   Procedure: Left knee arthroscopy, partial medial menisectomy;  Surgeon: Dereck Leep, MD;  Location: [facility] ORS;  Service: Orthopedics;  Laterality: Left;  . KNEE ARTHROSCOPY Bilateral   . MASTECTOMY Right 2010   BREAST CA  . PORTACATH PLACEMENT  2011  . TONSILLECTOMY AND ADENOIDECTOMY  1966  . TOTAL LAPAROSCOPIC HYSTERECTOMY WITH BILATERAL  SALPINGO OOPHORECTOMY     Prior to Admission medications   Medication Sig Start Date End Date Taking? Authorizing Provider  Apremilast (OTEZLA) 30 MG TABS Take 1 tablet by mouth daily.    Yes [provider]  b complex vitamins tablet Take 2 tablets by mouth daily.   Yes [provider]  Biotin 5000 MCG TABS Take 1 tablet by mouth.   Yes [provider]  CALCIUM-MAGNESIUM PO Take 1 tablet by mouth.   Yes [provider]  Calcium-Vitamin D 600-200 MG-UNIT per tablet Take 1 tablet by mouth daily.    Yes [provider]  Flaxseed, Linseed, (FLAX SEED OIL) 1300 MG CAPS Take 2 capsules by mouth daily with lunch.   Yes [provider]  levothyroxine (SYNTHROID) 88 MCG tablet Alternates 88 mcg one day, 100 mcg the next 11/07/18  Yes [provider]  levothyroxine (SYNTHROID, LEVOTHROID) 100 MCG tablet Take 1 tablet by mouth daily. 10/10/12  Yes [provider]  Multiple Minerals-Vitamins (CALCIUM & VIT D3 BONE HEALTH PO) Take 1,200 mg by mouth daily.   Yes [provider]  Multiple Vitamin (MULTIVITAMIN) capsule Take 1 capsule by mouth daily.   Yes [provider]  rosuvastatin (CRESTOR) 5 MG tablet Take 0.5 tablets by mouth daily. 05/23/18  Yes [provider]  vitamin  E 400 UNIT capsule Take 400 Units by mouth daily.   Yes [provider]    Allergies  Allergen Reactions  . Dilaudid [Hydromorphone Hcl] Nausea And Vomiting  . Alendronate     Other reaction(s): Other (See Comments) alopecia  . Codeine Nausea And Vomiting  . Codeine   . Demerol [Meperidine] Nausea Only  . Demerol [Meperidine]     Social History   Socioeconomic History  . Marital status: Married    Spouse name: Not on file  . Number of children: Not on file  . Years of education: Not on file  . Highest education level: Not on file  Occupational History  . Not on file  Tobacco Use  . Smoking status: Never Smoker  .  Smokeless tobacco: Never Used  Vaping Use  . Vaping Use: Never used  Substance and Sexual Activity  . Alcohol use: Never  . Drug use: Never  . Sexual activity: Yes    Birth control/protection: Surgical    Comment: Hysterectomy  Other Topics Concern  . Not on file  Social History Narrative   ** Merged History Encounter **       Social Determinants of Health   Financial Resource Strain: Not on file  Food Insecurity: Not on file  Transportation Needs: Not on file  Physical Activity: Not on file  Stress: Not on file  Social Connections: Not on file  Intimate Partner Violence: Not on file    Family History  Problem Relation Age of Onset  . Healthy Mother   . Healthy Father   . Diabetes Father   . Heart disease Father   . Colon cancer Paternal Grandfather 12  . Heart disease Paternal Grandfather   . Breast cancer Maternal Aunt 75  . Cancer - Other Sister 99       Vulvar  . Hypertension Maternal Grandmother     Review of Systems  Constitutional: Negative.   HENT: Negative.   Eyes: Negative.   Respiratory: Negative.   Cardiovascular: Negative.   Gastrointestinal: Negative.   Genitourinary: Negative.   Musculoskeletal: Negative.   Skin: Negative.   Neurological: Negative.   Psychiatric/Behavioral: Negative.     Physical Exam Vitals: BP 112/70   Ht 5' 3.5" (1.613 m)   Wt 136 lb (61.7 kg)   BMI 23.71 kg/m   Physical Exam Constitutional:      General: She is not in acute distress.    Appearance: Normal appearance. She is well-developed.  Genitourinary:     Vulva and bladder normal.     No vaginal discharge, erythema, tenderness or bleeding.      Right Adnexa: not tender, not full and no mass present.    Left Adnexa: not tender, not full and no mass present.    Cervical exam comments: Small stump likely present.     Uterus is absent.     Pelvic exam was performed with patient in the lithotomy position.  HENT:     Head: Normocephalic and atraumatic.  Eyes:      General: No scleral icterus.    Conjunctiva/sclera: Conjunctivae normal.  Neck:     Thyroid: No thyromegaly.  Cardiovascular:     Rate and Rhythm: Normal rate and regular rhythm.     Heart sounds: No murmur heard. No friction rub. No gallop.   Pulmonary:     Effort: Pulmonary effort is normal. No respiratory distress.     Breath sounds: Normal breath sounds. No wheezing or rales.  Abdominal:  General: Bowel sounds are normal. There is no distension.     Palpations: Abdomen is soft. There is no mass.     Tenderness: There is no abdominal tenderness. There is no guarding or rebound.  Musculoskeletal:        General: No swelling or tenderness. Normal range of motion.     Cervical back: Normal range of motion and neck supple.  Lymphadenopathy:     Cervical: No cervical adenopathy.     Lower Body: No right inguinal adenopathy. No left inguinal adenopathy.  Neurological:     General: No focal deficit present.     Mental Status: She is alert and oriented to person, place, and time.     Cranial Nerves: No cranial nerve deficit.  Skin:    General: Skin is warm and dry.     Findings: No erythema or rash.  Psychiatric:        Mood and Affect: Mood normal.        Behavior: Behavior normal.        Judgment: Judgment normal.     Female chaperone present for pelvic and breast  portions of the physical exam  Assessment and Plan:  59 y.o. No obstetric history on file. female here for routine annual gynecologic examination  Plan: Problem List Items Addressed This Visit   None   Visit Diagnoses    Women's annual routine gynecological examination    -  Primary     Screening: -- Blood pressure screen normal -- Colonoscopy - due - managed by PCP -- Mammogram - not due -- Weight screening: normal -- Depression screening negative (PHQ-9) -- Nutrition: normal -- cholesterol screening: per PCP -- osteoporosis screening: not due -- tobacco screening: not using -- alcohol  screening: AUDIT questionnaire indicates low-risk usage. -- family history of breast cancer screening: done. not at high risk. -- no evidence of domestic violence or intimate partner violence. -- STD screening: gonorrhea/chlamydia NAAT not collected per patient request. -- pap smear not collected per ASCCP guidelines -- flu vaccine will receive at work -- HPV vaccination series: not Lanetta Inch, MD 01/29/2020 1:40 PM

## 2020-03-08 ENCOUNTER — Ambulatory Visit: Payer: 59

## 2020-04-05 ENCOUNTER — Other Ambulatory Visit (HOSPITAL_COMMUNITY): Payer: Self-pay | Admitting: Internal Medicine

## 2020-04-05 ENCOUNTER — Ambulatory Visit: Payer: 59 | Attending: Internal Medicine

## 2020-04-05 DIAGNOSIS — Z23 Encounter for immunization: Secondary | ICD-10-CM

## 2020-04-05 NOTE — Progress Notes (Signed)
   Covid-19 Vaccination Clinic  Name:  NARE GASPARI    MRN: [id number] DOB: 19-Feb-1961  04/05/2020  Ms. Lynam was observed post Covid-19 immunization for 15 minutes without incident. She was provided with Vaccine Information Sheet and instruction to access the V-Safe system.   Ms. Marzo was instructed to call 911 with any severe reactions post vaccine: Marland Kitchen Difficulty breathing  . Swelling of face and throat  . A fast heartbeat  . A bad rash all over body  . Dizziness and weakness   Immunizations Administered    Name Date Dose VIS Date Route   PFIZER Comrnaty(Gray TOP) Covid-19 Vaccine 04/05/2020 11:26 AM 0.3 mL 01/28/2020 Intramuscular   Manufacturer: [employer]   Lot: [id number]   NDC: [phone]

## 2020-05-23 ENCOUNTER — Telehealth: Payer: Self-pay | Admitting: Obstetrics and Gynecology

## 2020-05-23 NOTE — Telephone Encounter (Signed)
Patient calling in, saw Plastic Surgeon, wants her to have hormone levels checked.  She  Is experiencing hair loss.  Plastic surgeon advises having progesterone and estrogen checked.  Please advise if you would be willing to order labs.

## 2020-05-26 NOTE — Telephone Encounter (Signed)
I"m ok checking hormone levels. These might not explain her hair loss, though, since we expect these levels to be fairly low given her menopausal status. I'm happy to give her a call, if she thinks that would be helpful.

## 2020-06-07 NOTE — Telephone Encounter (Signed)
Pt states she would like to go ahead and have some labs done for hormone levels checked. Can you place these orders and I can let pt know ? shes going to go to a [facility] that's more convenient.

## 2020-06-07 NOTE — Telephone Encounter (Signed)
Patient would like to have call back in regards to getting labs ordered. Patient is aware that SDJ is in the OR today.   Cb# [phone]

## 2020-06-07 NOTE — Telephone Encounter (Signed)
Have not been able to get in touch with pt. Left 2 messages. Will try again in the morning. Please let me know if she calls back today

## 2020-06-08 NOTE — Telephone Encounter (Signed)
Patient is calling to follow up on lab order. Patient is aware SDJ not in the Office until tomorrow. Please advise

## 2020-06-08 NOTE — Telephone Encounter (Signed)
I advised pt I would contact her tomorrow once labs are put in for her

## 2020-06-09 ENCOUNTER — Other Ambulatory Visit: Payer: Self-pay | Admitting: Obstetrics and Gynecology

## 2020-06-09 ENCOUNTER — Telehealth: Payer: Self-pay

## 2020-06-09 DIAGNOSIS — L659 Nonscarring hair loss, unspecified: Secondary | ICD-10-CM

## 2020-06-09 NOTE — Telephone Encounter (Signed)
Pt called to see exactly what labs were ordered by [facility] today; also needs them released.  [phone]

## 2020-06-09 NOTE — Telephone Encounter (Signed)
Tried to call pt to let her know labs are in. No answer. Please let her know when she calls back

## 2020-06-10 ENCOUNTER — Other Ambulatory Visit: Payer: Self-pay

## 2020-06-10 DIAGNOSIS — L659 Nonscarring hair loss, unspecified: Secondary | ICD-10-CM

## 2020-06-10 NOTE — Telephone Encounter (Signed)
Pt aware of labs ordered and is very appreciative; aware labs have been released and req# given.

## 2020-06-10 NOTE — Telephone Encounter (Signed)
Tried to call pt this AM. Please let pt know if she calls back waht labs were ordered. And should be released. We have full schedule today so I might not get to talk to her

## 2020-06-20 ENCOUNTER — Other Ambulatory Visit: Payer: Self-pay | Admitting: Family Medicine

## 2020-06-20 DIAGNOSIS — Z9882 Breast implant status: Secondary | ICD-10-CM

## 2020-06-20 LAB — FOLLICLE STIMULATING HORMONE: FSH: 85.9 m[IU]/mL

## 2020-06-20 LAB — TSH: TSH: 0.647 u[IU]/mL (ref 0.450–4.500)

## 2020-06-20 LAB — PROGESTERONE: Progesterone: 0.1 ng/mL

## 2020-06-20 LAB — VITAMIN D 25 HYDROXY (VIT D DEFICIENCY, FRACTURES): Vit D, 25-Hydroxy: 145 ng/mL — ABNORMAL HIGH (ref 30.0–100.0)

## 2020-06-20 LAB — TESTOSTERONE,FREE AND TOTAL
Testosterone, Free: <0.2 pg/mL (ref 0.0–4.2)
Testosterone: <3 ng/dL — ABNORMAL LOW (ref 4–50)

## 2020-06-20 LAB — DHEA-SULFATE: DHEA-SO4: 17.5 ug/dL — ABNORMAL LOW (ref 29.4–220.5)

## 2020-06-20 LAB — PROLACTIN: Prolactin: 13.5 ng/mL (ref 4.8–23.3)

## 2020-06-20 LAB — 17-HYDROXYPROGESTERONE: 17-Hydroxyprogesterone: 11 ng/dL

## 2020-06-20 LAB — ESTRADIOL: Estradiol: <5 pg/mL

## 2020-07-13 ENCOUNTER — Ambulatory Visit
Admission: RE | Admit: 2020-07-13 | Discharge: 2020-07-13 | Disposition: A | Discharge status: Home / Self Care | Payer: 59 | Source: Ambulatory Visit | Attending: Family Medicine | Admitting: Family Medicine

## 2020-07-13 ENCOUNTER — Other Ambulatory Visit: Payer: Self-pay

## 2020-07-13 DIAGNOSIS — Z9882 Breast implant status: Secondary | ICD-10-CM

## 2020-07-13 MED ORDER — GADOBUTROL 1 MMOL/ML IV SOLN
6.0000 mL | Freq: Once | INTRAVENOUS | Status: AC | PRN
  Administered 2020-07-13: 6 mL via INTRAVENOUS

## 2020-07-14 ENCOUNTER — Other Ambulatory Visit (HOSPITAL_COMMUNITY): Payer: Self-pay | Admitting: Family Medicine

## 2020-07-14 ENCOUNTER — Other Ambulatory Visit: Payer: Self-pay | Admitting: Family Medicine

## 2020-07-14 DIAGNOSIS — R928 Other abnormal and inconclusive findings on diagnostic imaging of breast: Secondary | ICD-10-CM

## 2020-07-15 ENCOUNTER — Ambulatory Visit
Admission: RE | Admit: 2020-07-15 | Discharge: 2020-07-15 | Disposition: A | Discharge status: Home / Self Care | Payer: 59 | Source: Ambulatory Visit | Attending: Family Medicine | Admitting: Family Medicine

## 2020-07-15 ENCOUNTER — Other Ambulatory Visit: Payer: Self-pay

## 2020-07-15 DIAGNOSIS — R928 Other abnormal and inconclusive findings on diagnostic imaging of breast: Secondary | ICD-10-CM | POA: Diagnosis present

## 2020-08-01 IMAGING — CR LEFT FOOT - COMPLETE 3+ VIEW
3 series · 3 of 3 positions shown · non-contrast
Comparison: None.

CLINICAL DATA: Right foot pain, runner

EXAM:
LEFT FOOT - COMPLETE 3+ VIEW

[foot ap]
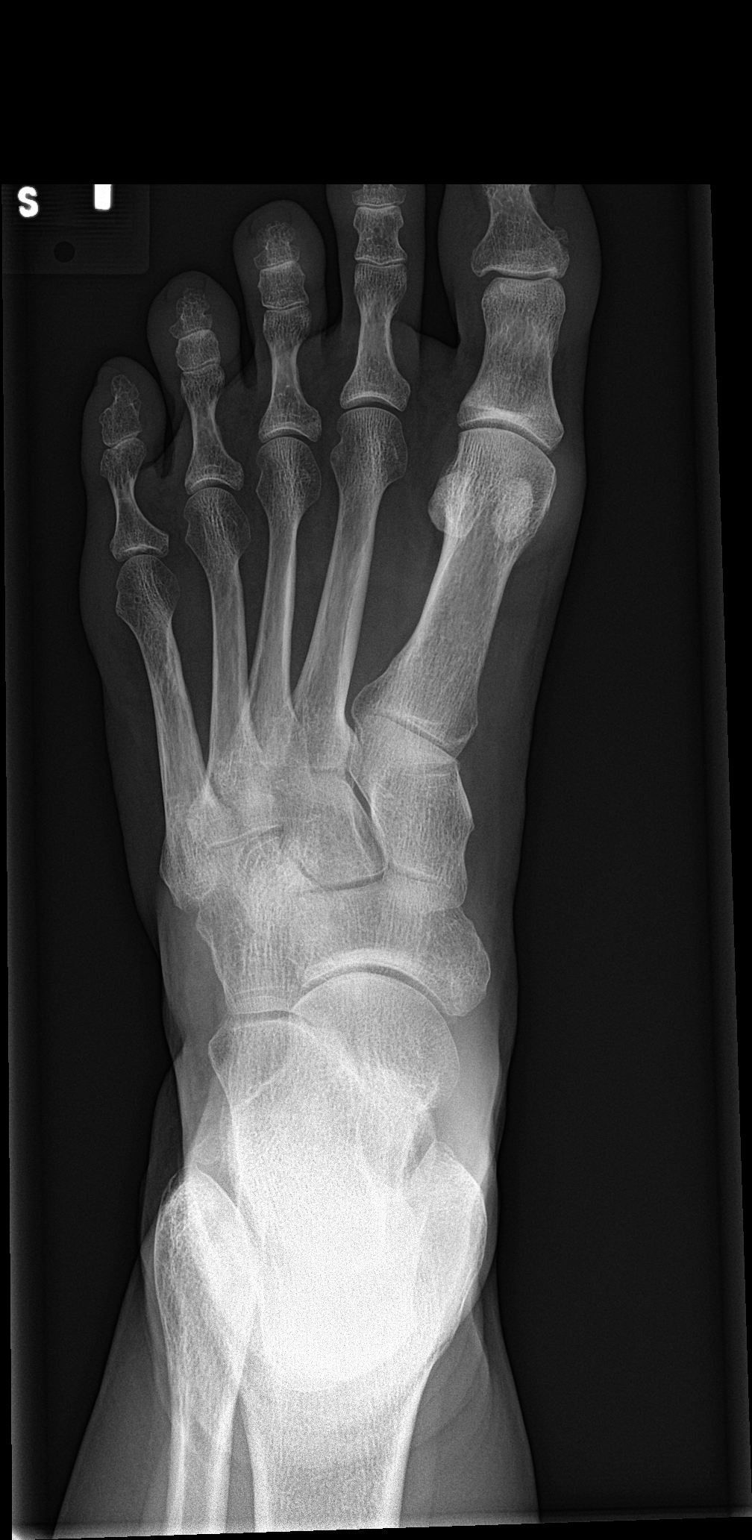

[foot obl]
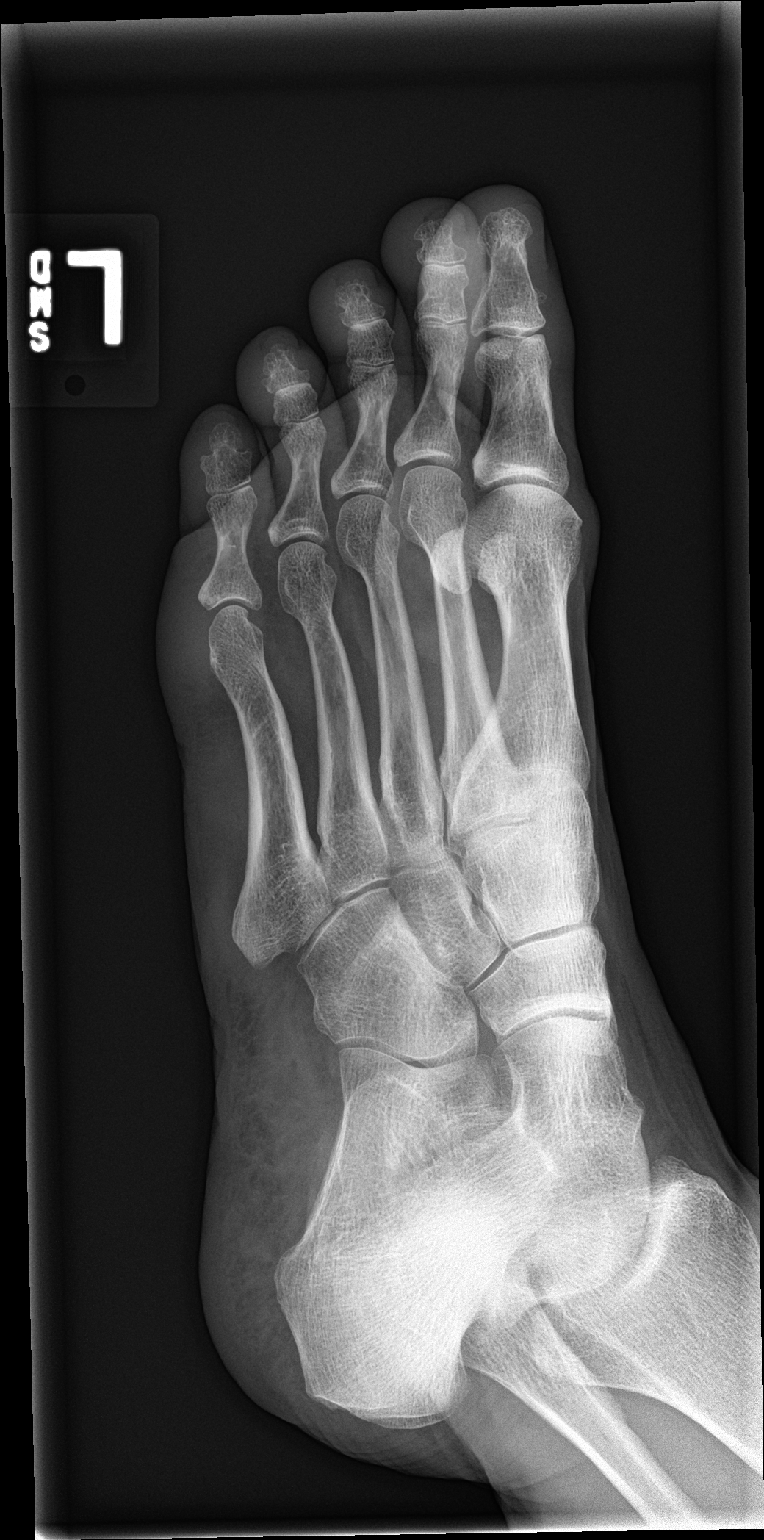

[foot lat]
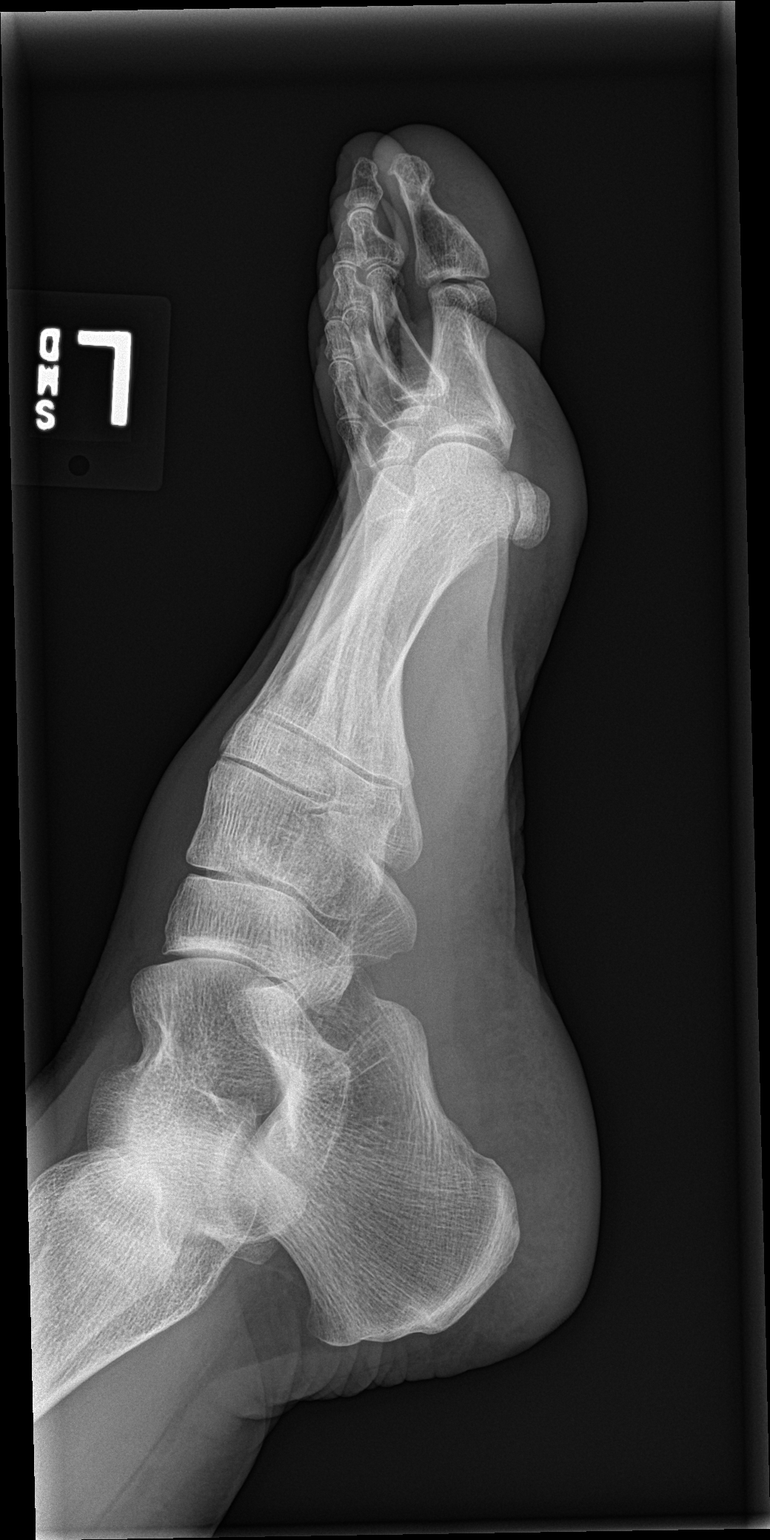

[3 of 3 positions shown; findings below may reference images not displayed]

FINDINGS: There is no evidence of fracture or dislocation. There is no
evidence of arthropathy or other focal bone abnormality. Soft
tissues are unremarkable.
IMPRESSION: Negative.

## 2020-08-05 ENCOUNTER — Ambulatory Visit (INDEPENDENT_AMBULATORY_CARE_PROVIDER_SITE_OTHER): Payer: 59

## 2020-08-05 ENCOUNTER — Other Ambulatory Visit: Payer: Self-pay

## 2020-08-05 ENCOUNTER — Ambulatory Visit
Admission: EM | Admit: 2020-08-05 | Discharge: 2020-08-05 | Disposition: A | Discharge status: Home / Self Care | Payer: 59

## 2020-08-05 DIAGNOSIS — S0992XA Unspecified injury of nose, initial encounter: Secondary | ICD-10-CM

## 2020-08-05 DIAGNOSIS — S022XXA Fracture of nasal bones, initial encounter for closed fracture: Secondary | ICD-10-CM

## 2020-08-05 NOTE — ED Provider Notes (Signed)
[facility]    CSN: [id number] Arrival date & time: 08/05/20  0845      History   Chief Complaint Chief Complaint  Patient presents with   Facial Injury    HPI Susan Green is a 60 y.o. female.   HPI  60 year old female here for evaluation of nasal injury.  Patient reports that she and her husband were roughhousing last night and they collided heads where her nose struck his forehead.  She reports that she heard a loud crack and iced it immediately.  She did not have any bleeding from her nose but she is concerned because there is swelling at the bridge of her nose.  She has consulted ENT who advised her to come to the urgent care for x-rays.  She is set to follow-up with them in 3 days.  Past Medical History:  Diagnosis Date   174.9 2011   Right breast T1c,N1 (mic),ER 90%; PR 90%, Her 2 neu not over expressed treated with chemotherapy and reconstruction   Breast cancer ([place]) 01/2009   Right with Chemo   Cancer ([place])    Personal history of chemotherapy 2010   BREAST CA   PONV (postoperative nausea and vomiting)    very naseated   Psoriasis    Thyroid disease    hypothyroidism    Patient Active Problem List   Diagnosis Date Noted   Osteopenia of neck of right femur 02/11/2019   SBO (small bowel obstruction) ([place]) 10/04/2018   Carcinoma of overlapping 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, estrogen receptor positive ([place]) 08/24/2016   Mass of arm, left 05/09/2016    Past Surgical History:  Procedure Laterality Date   APPENDECTOMY  04/29/13   APPENDECTOMY     AUGMENTATION MAMMAPLASTY Bilateral 08/2009   Reconstruction   BREAST BIOPSY Right 2010   Positive   BREAST RECONSTRUCTION  2011, 2012   3   BREAST RECONSTRUCTION Right 2011   BREAST SURGERY Right 01/25/2009   mastectomy   BREAST SURGERY Left 2011   made to match the right breast   BREAST SURGERY     cecopexy  04-29-13   CRANIOTOMY FOR AVM  [place] OF UTERUS  2008   polyp    KNEE ARTHROSCOPY Right 2005   KNEE ARTHROSCOPY Left 09/22/2014   Procedure: Left knee arthroscopy, partial medial menisectomy;  Surgeon: Dereck Leep, MD;  Location: [facility] ORS;  Service: Orthopedics;  Laterality: Left;   KNEE ARTHROSCOPY Bilateral    MASTECTOMY Right 2010   BREAST CA   PORTACATH PLACEMENT  2011   TONSILLECTOMY AND ADENOIDECTOMY  1966   TOTAL LAPAROSCOPIC HYSTERECTOMY WITH BILATERAL SALPINGO OOPHORECTOMY      OB History   No obstetric history on file.    Obstetric Comments  1st Menstrual Cycle:  12 1st Pregnancy:  0          Home Medications    Prior to Admission medications   Medication Sig Start Date End Date Taking? Authorizing Provider  Apremilast (OTEZLA) 30 MG TABS Take 1 tablet by mouth daily.    Yes [provider]  b complex vitamins tablet Take 2 tablets by mouth daily.   Yes [provider]  Biotin 5000 MCG TABS Take 1 tablet by mouth.   Yes [provider]  CALCIUM-MAGNESIUM PO Take 1 tablet by mouth.   Yes [provider]  Calcium-Vitamin D 600-200 MG-UNIT per tablet Take 1 tablet by mouth daily.  Yes [provider]  conjugated estrogens (PREMARIN) vaginal cream Place 1 Applicatorful vaginally 2 (two) times a week. 1 gram vaginally at bedtime twice weekly 12/08/18  Yes Will Bonnet, MD  Flaxseed, Linseed, (FLAX SEED OIL) 1300 MG CAPS Take 2 capsules by mouth daily with lunch.   Yes [provider]  levothyroxine (SYNTHROID) 100 MCG tablet Take 1 tablet by mouth 2 (two) times a week.   Yes [provider]  levothyroxine (SYNTHROID) 88 MCG tablet Take 1 tablet by mouth 3 (three) times a week.   Yes [provider]  liothyronine (CYTOMEL) 5 MCG tablet 1/2 tab MWF. Take on an empty stomach with a glass of water at least 30-60 minutes before breakfast. 06/09/19  Yes [provider]  rosuvastatin (CRESTOR) 5 MG tablet Take 0.5 tablets by mouth daily. 05/23/18  Yes  [provider]  vitamin E 400 UNIT capsule Take 400 Units by mouth daily.   Yes [provider]    Family History Family History  Problem Relation Age of Onset   Healthy Mother    Healthy Father    Diabetes Father    Heart disease Father    Colon cancer Paternal Grandfather 89   Heart disease Paternal Grandfather    Breast cancer Maternal Aunt 75   Cancer - Other Sister 88       Vulvar   Hypertension Maternal Grandmother     Social History Social History   Tobacco Use   Smoking status: Never   Smokeless tobacco: Never  Vaping Use   Vaping Use: Never used  Substance Use Topics   Alcohol use: Never   Drug use: Never     Allergies   Dilaudid [hydromorphone hcl], Alendronate, Codeine, and Demerol [meperidine]   Review of Systems Review of Systems  Constitutional:  Negative for activity change, appetite change and fever.  HENT:  Positive for facial swelling. Negative for nosebleeds and rhinorrhea.   Neurological:  Negative for syncope.    Physical Exam Triage Vital Signs ED Triage Vitals  Enc Vitals Group     BP 08/05/20 0902 137/79     Pulse Rate 08/05/20 0902 60     Resp 08/05/20 0902 18     Temp 08/05/20 0902 98.3 F (36.8 C)     Temp Source 08/05/20 0902 Oral     SpO2 08/05/20 0902 99 %     Weight 08/05/20 0856 129 lb (58.5 kg)     Height 08/05/20 0856 5\' 3"  (1.6 m)     Head Circumference --      Peak Flow --      Pain Score 08/05/20 0855 6     Pain Loc --      Pain Edu? --      Excl. in [place]? --    No data found.  Updated Vital Signs BP 137/79 (BP Location: Left Arm)   Pulse 60   Temp 98.3 F (36.8 C) (Oral)   Resp 18   Ht 5\' 3"  (1.6 m)   Wt 129 lb (58.5 kg)   SpO2 99%   BMI 22.85 kg/m   Visual Acuity Right Eye Distance:   Left Eye Distance:   Bilateral Distance:    Right Eye Near:   Left Eye Near:    Bilateral Near:     Physical Exam Vitals and nursing note reviewed.  Constitutional:      General: She is not  in acute distress.    Appearance: Normal appearance. She  is normal weight. She is not ill-appearing.  HENT:     Nose: No rhinorrhea.     Comments: Patient is swelling of the bridge of her nose but no ecchymosis.  Nasal mucosa is pink and moist with patency to both naris.  No evidence of septal hematoma on exam. Musculoskeletal:        General: Swelling and tenderness present.  Skin:    General: Skin is warm and dry.     Capillary Refill: Capillary refill takes less than 2 seconds.     Findings: No bruising or erythema.  Neurological:     General: No focal deficit present.     Mental Status: She is alert and oriented to person, place, and time.  Psychiatric:        Mood and Affect: Mood normal.        Behavior: Behavior normal.        Thought Content: Thought content normal.        Judgment: Judgment normal.     UC Treatments / Results  Labs (all labs ordered are listed, but only abnormal results are displayed) Labs Reviewed - No data to display  EKG   Radiology DG Nasal Bones  Result Date: 08/05/2020 CLINICAL DATA:  Pain after hitting nose against solid object EXAM: NASAL BONES - 3+ VIEW COMPARISON:  December 09, 2019 FINDINGS: Frontal, lateral, and water's views obtained. Small avulsion noted along the distal aspect of the nasion. No other evident fracture. No dislocation. Paranasal sinuses are clear. IMPRESSION: Small avulsion along the distal most aspect of the nasion. Study otherwise unremarkable. Electronically Signed   By: Lowella Grip III M.D.   On: 08/05/2020 09:34    Procedures Procedures (including critical care time)  Medications Ordered in UC Medications - No data to display  Initial Impression / Assessment and Plan / UC Course  I have reviewed the triage vital signs and the nursing notes.  Pertinent labs & imaging results that were available during my care of the patient were reviewed by me and considered in my medical decision making (see chart for  details).  , Nontoxic-appearing 60 year old female here for evaluation of nasal injury as described in the HPI above.  Patient's physical exam reveals mild swelling to the bridge of the nose where the nasal cartilage and nasal bones meet.  There is no crepitus to palpation on exam.  Patient is very mild tenderness to palpation and no tenderness of the glabella or nasal bones.  Also no tenderness to palpation of either orbit.  Visual exam of the nasal mucosa does not reveal the presence of a septal hematoma and both nares are patent.  Patient is requesting x-rays as she was suggested by ENT to have said x-rays performed.  She is set to follow-up with them on Monday (3 days from now).  Will obtain x-rays of nasal bones.  Nasal bone x-rays reviewed and independently evaluated by me.  Interpretation: There is a nondisplaced fracture of the nasal cartilage.  Awaiting radiology overread.  Radiology overread:Small avulsion along the distal most aspect of the nasion. Study otherwise unremarkable  Will discharge patient home and have her follow-up with ENT on Monday as scheduled.  We will have patient take Tylenol for pain.  We will give patient a work note for this weekend.   Final Clinical Impressions(s) / UC Diagnoses   Final diagnoses:  Closed fracture of nasal bone, initial encounter   Discharge Instructions   None    ED Prescriptions  None    PDMP not reviewed this encounter.   Margarette Canada, NP 08/05/20 [phone]

## 2020-08-05 NOTE — Discharge Instructions (Addendum)
Your x-rays reveal a slight avulsion of your nasion the depression between your eyes where your to nasal bones meet.  You may continue to apply ice to help with swelling, 20 minutes at a time.  He may also continue take Tylenol and ibuprofen as needed for pain.  Follow-up with ENT on Monday as previously scheduled.

## 2020-08-05 NOTE — ED Triage Notes (Signed)
Pt c/o hitting her nose on her husband's forehead last night. She is concerned her nose may be broken. Pt does have appt with ENT this coming Monday. Pt was advised to go ahead and get xrays. Pt does have swelling and bruising to the nose.

## 2020-09-21 ENCOUNTER — Inpatient Hospital Stay (HOSPITAL_BASED_OUTPATIENT_CLINIC_OR_DEPARTMENT_OTHER): Payer: 59 | Admitting: Internal Medicine

## 2020-09-21 ENCOUNTER — Other Ambulatory Visit: Payer: Self-pay

## 2020-09-21 ENCOUNTER — Inpatient Hospital Stay: Payer: 59 | Attending: Internal Medicine

## 2020-09-21 DIAGNOSIS — C50811 Malignant neoplasm of overlapping sites of right female breast: Secondary | ICD-10-CM | POA: Insufficient documentation

## 2020-09-21 DIAGNOSIS — D751 Secondary polycythemia: Secondary | ICD-10-CM | POA: Insufficient documentation

## 2020-09-21 DIAGNOSIS — Z170 Estrogen receptor positive status [ER+]: Secondary | ICD-10-CM | POA: Insufficient documentation

## 2020-09-21 LAB — CBC WITH DIFFERENTIAL/PLATELET
Abs Immature Granulocytes: 0.01 10*3/uL (ref 0.00–0.07)
Basophils Absolute: 0 10*3/uL (ref 0.0–0.1)
Basophils Relative: 1 %
Eosinophils Absolute: 0.2 10*3/uL (ref 0.0–0.5)
Eosinophils Relative: 3 %
HCT: 47.6 % — ABNORMAL HIGH (ref 36.0–46.0)
Hemoglobin: 15.6 g/dL — ABNORMAL HIGH (ref 12.0–15.0)
Immature Granulocytes: 0 %
Lymphocytes Relative: 21 %
Lymphs Abs: 1.2 10*3/uL (ref 0.7–4.0)
MCH: 30.6 pg (ref 26.0–34.0)
MCHC: 32.8 g/dL (ref 30.0–36.0)
MCV: 93.3 fL (ref 80.0–100.0)
Monocytes Absolute: 0.5 10*3/uL (ref 0.1–1.0)
Monocytes Relative: 8 %
Neutro Abs: 3.9 10*3/uL (ref 1.7–7.7)
Neutrophils Relative %: 67 %
Platelets: 183 10*3/uL (ref 150–400)
RBC: 5.1 MIL/uL (ref 3.87–5.11)
RDW: 13.1 % (ref 11.5–15.5)
WBC: 5.8 10*3/uL (ref 4.0–10.5)
nRBC: 0 % (ref 0.0–0.2)

## 2020-09-21 LAB — COMPREHENSIVE METABOLIC PANEL
ALT: 19 U/L (ref 0–44)
AST: 28 U/L (ref 15–41)
Albumin: 4.5 g/dL (ref 3.5–5.0)
Alkaline Phosphatase: 49 U/L (ref 38–126)
Anion gap: 8 (ref 5–15)
BUN: 22 mg/dL — ABNORMAL HIGH (ref 6–20)
CO2: 28 mmol/L (ref 22–32)
Calcium: 9.3 mg/dL (ref 8.9–10.3)
Chloride: 100 mmol/L (ref 98–111)
Creatinine, Ser: 1.03 mg/dL — ABNORMAL HIGH (ref 0.44–1.00)
GFR, Estimated: >60 mL/min (ref >60)
Glucose, Bld: 94 mg/dL (ref 70–99)
Potassium: 4 mmol/L (ref 3.5–5.1)
Sodium: 136 mmol/L (ref 135–145)
Total Bilirubin: 0.5 mg/dL (ref 0.3–1.2)
Total Protein: 7.7 g/dL (ref 6.5–8.1)

## 2020-09-21 NOTE — Progress Notes (Signed)
States that radiologist feels that she has "fatty tissue" or "scar tissue" in right breast and wants her to have a repeat MRI in November. Also reports mole to her right temple that she has just noticed the last few weeks, and states that it is raised and itching. Interested in starting bisphosphonate infusion.Distressed about hair loss.

## 2020-09-21 NOTE — Assessment & Plan Note (Addendum)
#  STAGE I -right breast ER/PR positive, HER-2 not overexpressed.  Finished tamoxifen sep 2017; under active surveillance.  Stable.  Clinically no evidence of recurrence. MAY 2022- MRI breast-post right mastectomy reconstruction however about 4 mm intramammary lymph node present.  Repeat diagnostic breast ultrasound is recommended in 6 months.  # Dexa Scan: Done in SEP 2020- T score -2.1. Continue calcium and vitamin D/exercise.  Again discussed patient risk factors for osteoporosis; and would recommend bisphosphonates.  Discussed the potential side effects including but not limited to infusion reaction; hypocalcemia and rare osteonecrosis of jaw. * pt will call re: reclast/ after PrP  # Mild Erythrocytosis: JAK-2 NEGATIVE; recommend increase in  Fluids; no phlebotomy.  #Hair loss-s/p evaluation with dermatology/awaiting plasma replacement therapy.  Defer to dermatology/plastic surgery.  # Left frontal papule [Dr.Graham]; Hx of skin cancer-stable  # DISPOSITION: print out labs  # follow up in 12 months; MD-labs- cbc/cmp ca-27-29; reclast.-Dr.B

## 2020-09-21 NOTE — Progress Notes (Signed)
[facility] OFFICE PROGRESS NOTE  Patient Care Team: Derinda Late, MD as PCP - General (Family Medicine) Bary Castilla, Forest Gleason, MD as Consulting Physician (General Surgery) Derinda Late, MD (Family Medicine) Cammie Sickle, MD as Consulting Physician (Hematology and Oncology)  Cancer Staging No matching staging information was found for the patient.   Oncology History Overview Note  carcinoma of breast, diagnosis.  In February of 2011.  Right breast.  ER positive.  PR positive, HER-2/neu, and not overexpressed s/p Right mastec  Status post 4 cycles of chemotherapy with Cytoxan, and Taxotere Finished chemotherapy on 07/07/2009  Status post bilateral   oopherectomy  and  hysterectomy in February of 2012   Tamoxifen June of 2011   # Genetcis- delcines  # Osteopenia- hair loss sec to F[place]om[place]   Carcinoma of overlapping 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, estrogen receptor positive ([place])  08/24/2016 Initial Diagnosis   Carcinoma of overlapping 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, estrogen receptor positive ([place])       INTERVAL HISTORY:  Susan Green 60 y.o.  female pleasant patient above history of breast cancer is here for follow-up.   Patient is quite stressed because of her family/social issues.  However she denies any new lumps or bumps.  Appetite is good.  No weight loss.  She continues to be physically active.   Review of Systems  Constitutional:  Negative for chills, diaphoresis, fever, malaise/fatigue and weight loss.  HENT:  Negative for nosebleeds and sore throat.   Eyes:  Negative for double vision.  Respiratory:  Negative for cough, hemoptysis, sputum production, shortness of breath and wheezing.   Cardiovascular:  Negative for chest pain, palpitations, orthopnea and leg swelling.  Gastrointestinal:  Negative for abdominal pain, blood in stool, constipation, diarrhea, heartburn, melena, nausea and vomiting.  Genitourinary:  Negative for dysuria,  frequency and urgency.  Musculoskeletal:  Negative for back pain and joint pain.  Skin: Negative.  Negative for itching and rash.  Neurological:  Negative for dizziness, tingling, focal weakness, weakness and headaches.  Endo/Heme/Allergies:  Does not bruise/bleed easily.  Psychiatric/Behavioral:  Negative for depression. The patient is not nervous/anxious and does not have insomnia.      PAST MEDICAL HISTORY :  Past Medical History:  Diagnosis Date   174.9 2011   Right breast T1c,N1 (mic),ER 90%; PR 90%, Her 2 neu not over expressed treated with chemotherapy and reconstruction   Breast cancer ([place]) 01/2009   Right with Chemo   Cancer ([place])    Personal history of chemotherapy 2010   BREAST CA   PONV (postoperative nausea and vomiting)    very naseated   Psoriasis    Thyroid disease    hypothyroidism    PAST SURGICAL HISTORY :   Past Surgical History:  Procedure Laterality Date   APPENDECTOMY  04/29/13   APPENDECTOMY     AUGMENTATION MAMMAPLASTY Bilateral 08/2009   Reconstruction   BREAST BIOPSY Right 2010   Positive   BREAST RECONSTRUCTION  2011, 2012   3   BREAST RECONSTRUCTION Right 2011   BREAST SURGERY Right 01/25/2009   mastectomy   BREAST SURGERY Left 2011   made to match the right breast   BREAST SURGERY     cecopexy  04-29-13   CRANIOTOMY FOR AVM  [place]  2008   polyp   KNEE ARTHROSCOPY Right 2005   KNEE ARTHROSCOPY Left 09/22/2014   Procedure: Left knee arthroscopy, partial medial menisectomy;  Surgeon: Dereck Leep, MD;  Location: [facility] ORS;  Service: Orthopedics;  Laterality: Left;   KNEE ARTHROSCOPY Bilateral    MASTECTOMY Right 2010   BREAST CA   PORTACATH PLACEMENT  2011   TONSILLECTOMY AND ADENOIDECTOMY  1966   TOTAL LAPAROSCOPIC HYSTERECTOMY WITH BILATERAL SALPINGO OOPHORECTOMY      FAMILY HISTORY :   Family History  Problem Relation Age of Onset   Healthy Mother    Healthy Father    Diabetes Father    Heart  disease Father    Colon cancer Paternal Grandfather 47   Heart disease Paternal Grandfather    Breast cancer Maternal Aunt 75   Cancer - Other Sister 22       Vulvar   Hypertension Maternal Grandmother     SOCIAL HISTORY:   Social History   Tobacco Use   Smoking status: Never   Smokeless tobacco: Never  Vaping Use   Vaping Use: Never used  Substance Use Topics   Alcohol use: Never   Drug use: Never    ALLERGIES:  is allergic to dilaudid [hydromorphone hcl], alendronate, codeine, and demerol [meperidine].  MEDICATIONS:  Current Outpatient Medications  Medication Sig Dispense Refill   Apremilast (OTEZLA) 30 MG TABS Take 1 tablet by mouth daily.      b complex vitamins tablet Take 2 tablets by mouth daily.     Biotin 5000 MCG TABS Take 1 tablet by mouth.     CALCIUM-MAGNESIUM PO Take 1 tablet by mouth.     Calcium-Vitamin D 600-200 MG-UNIT per tablet Take 1 tablet by mouth daily.      conjugated estrogens (PREMARIN) vaginal cream Place 1 Applicatorful vaginally 2 (two) times a week. 1 gram vaginally at bedtime twice weekly 30 g 3   Flaxseed, Linseed, (FLAX SEED OIL) 1300 MG CAPS Take 2 capsules by mouth daily with lunch.     levothyroxine (SYNTHROID) 100 MCG tablet Take 1 tablet by mouth 2 (two) times a week.     levothyroxine (SYNTHROID) 88 MCG tablet Take 1 tablet by mouth 3 (three) times a week.     liothyronine (CYTOMEL) 5 MCG tablet 1/2 tab MWF. Take on an empty stomach with a glass of water at least 30-60 minutes before breakfast.     rosuvastatin (CRESTOR) 5 MG tablet Take 0.5 tablets by mouth daily.     vitamin E 400 UNIT capsule Take 400 Units by mouth daily.     No current facility-administered medications for this visit.    PHYSICAL EXAMINATION: ECOG PERFORMANCE STATUS: 0 - Asymptomatic  BP 126/80   Pulse (!) 56   Temp 97.9 F (36.6 C) (Tympanic)   Resp 18   Wt 61.2 kg   SpO2 100%   BMI 23.91 kg/m   Filed Weights   09/21/20 1036  Weight: 61.2 kg     Physical Exam HENT:     Head: Normocephalic and atraumatic.     Mouth/Throat:     Pharynx: No oropharyngeal exudate.  Eyes:     Pupils: Pupils are equal, round, and reactive to light.  Cardiovascular:     Rate and Rhythm: Normal rate and regular rhythm.  Pulmonary:     Effort: No respiratory distress.     Breath sounds: No wheezing.  Abdominal:     General: Bowel sounds are normal. There is no distension.     Palpations: Abdomen is soft. There is no mass.     Tenderness: There is no abdominal tenderness. There is no  guarding or rebound.  Musculoskeletal:        General: No tenderness. Normal range of motion.     Cervical back: Normal range of motion and neck supple.  Skin:    General: Skin is warm.     Comments: Breast exam  [in presence of nurse] right breast mastectomy ; implant-no lumps or bumps.  Left breast no unusual skin changes or dominant masses felt-implant in place   Neurological:     Mental Status: She is alert and oriented to person, place, and time.  Psychiatric:        Mood and Affect: Affect normal.   LABORATORY DATA:  I have reviewed the data as listed    Component Value Date/Time   NA 136 09/21/2020 1005   NA 140 01/27/2014 1538   K 4.0 09/21/2020 1005   K 3.7 01/27/2014 1538   CL 100 09/21/2020 1005   CL 103 01/27/2014 1538   CO2 28 09/21/2020 1005   CO2 34 (H) 01/27/2014 1538   GLUCOSE 94 09/21/2020 1005   GLUCOSE 140 (H) 01/27/2014 1538   BUN 22 (H) 09/21/2020 1005   BUN 18 01/27/2014 1538   CREATININE 1.03 (H) 09/21/2020 1005   CREATININE 1.02 01/27/2014 1538   CALCIUM 9.3 09/21/2020 1005   CALCIUM 8.4 (L) 01/27/2014 1538   PROT 7.7 09/21/2020 1005   PROT 6.7 01/27/2014 1538   ALBUMIN 4.5 09/21/2020 1005   ALBUMIN 3.4 01/27/2014 1538   AST 28 09/21/2020 1005   AST 22 01/27/2014 1538   ALT 19 09/21/2020 1005   ALT 22 01/27/2014 1538   ALKPHOS 49 09/21/2020 1005   ALKPHOS 49 01/27/2014 1538   BILITOT 0.5 09/21/2020 1005   BILITOT  0.3 01/27/2014 1538   GFRNONAA >60 09/21/2020 1005   GFRNONAA >60 01/27/2014 1538   GFRNONAA >60 05/22/2013 1212   GFRAA >60 09/22/2019 0921   GFRAA >60 01/27/2014 1538   GFRAA >60 05/22/2013 1212    No results found for: SPEP, UPEP  Lab Results  Component Value Date   WBC 5.8 09/21/2020   NEUTROABS 3.9 09/21/2020   HGB 15.6 (H) 09/21/2020   HCT 47.6 (H) 09/21/2020   MCV 93.3 09/21/2020   PLT 183 09/21/2020      Chemistry      Component Value Date/Time   NA 136 09/21/2020 1005   NA 140 01/27/2014 1538   K 4.0 09/21/2020 1005   K 3.7 01/27/2014 1538   CL 100 09/21/2020 1005   CL 103 01/27/2014 1538   CO2 28 09/21/2020 1005   CO2 34 (H) 01/27/2014 1538   BUN 22 (H) 09/21/2020 1005   BUN 18 01/27/2014 1538   CREATININE 1.03 (H) 09/21/2020 1005   CREATININE 1.02 01/27/2014 1538      Component Value Date/Time   CALCIUM 9.3 09/21/2020 1005   CALCIUM 8.4 (L) 01/27/2014 1538   ALKPHOS 49 09/21/2020 1005   ALKPHOS 49 01/27/2014 1538   AST 28 09/21/2020 1005   AST 22 01/27/2014 1538   ALT 19 09/21/2020 1005   ALT 22 01/27/2014 1538   BILITOT 0.5 09/21/2020 1005   BILITOT 0.3 01/27/2014 1538       RADIOGRAPHIC STUDIES: I have personally reviewed the radiological images as listed and agreed with the findings in the report. No results found.   ASSESSMENT & PLAN:  Carcinoma of overlapping 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, estrogen receptor positive ([place]) # STAGE I -right breast ER/PR positive, HER-2 not overexpressed.  Finished  tamoxifen sep 2017; under active surveillance.  Stable.  Clinically no evidence of recurrence. MAY 2022- MRI breast-post right mastectomy reconstruction however about 4 mm intramammary lymph node present.  Repeat diagnostic breast ultrasound is recommended in 6 months.  # Dexa Scan: Done in SEP 2020- T score -2.1. Continue calcium and vitamin D/exercise.  Again discussed patient risk factors for osteoporosis; and would recommend bisphosphonates.   Discussed the potential side effects including but not limited to infusion reaction; hypocalcemia and rare osteonecrosis of jaw. * pt will call re: reclast/ after PrP  # Mild Erythrocytosis: JAK-2 NEGATIVE; recommend increase in  Fluids; no phlebotomy.  #Hair loss-s/p evaluation with dermatology/awaiting plasma replacement therapy.  Defer to dermatology/plastic surgery.  # Left frontal papule [Dr.Graham]; Hx of skin cancer-stable  # DISPOSITION: print out labs  # follow up in 12 months; MD-labs- cbc/cmp ca-27-29; reclast.-Dr.B   Orders Placed This Encounter  Procedures   CBC with Differential    Standing Status:   Future    Standing Expiration Date:   09/21/2021   Comprehensive metabolic panel    Standing Status:   Future    Standing Expiration Date:   09/21/2021   Cancer antigen 27.29    Standing Status:   Future    Standing Expiration Date:   09/21/2021   All questions were answered. The patient knows to call the clinic with any problems, questions or concerns.      Cammie Sickle, MD 10/02/2020 10:03 PM

## 2020-09-22 LAB — CANCER ANTIGEN 27.29: CA 27.29: 19.1 U/mL (ref 0.0–38.6)

## 2021-01-23 ENCOUNTER — Other Ambulatory Visit: Payer: Self-pay | Admitting: Family Medicine

## 2021-01-23 DIAGNOSIS — R928 Other abnormal and inconclusive findings on diagnostic imaging of breast: Secondary | ICD-10-CM

## 2021-02-16 ENCOUNTER — Ambulatory Visit
Admission: RE | Admit: 2021-02-16 | Discharge: 2021-02-16 | Disposition: A | Discharge status: Home / Self Care | Payer: 59 | Source: Ambulatory Visit | Attending: Family Medicine | Admitting: Family Medicine

## 2021-02-16 ENCOUNTER — Other Ambulatory Visit: Payer: 59

## 2021-02-16 ENCOUNTER — Other Ambulatory Visit: Payer: Self-pay

## 2021-02-16 DIAGNOSIS — R928 Other abnormal and inconclusive findings on diagnostic imaging of breast: Secondary | ICD-10-CM

## 2021-02-16 MED ORDER — GADOBUTROL 1 MMOL/ML IV SOLN
6.0000 mL | Freq: Once | INTRAVENOUS | Status: AC | PRN
  Administered 2021-02-16: 16:00:00 6 mL via INTRAVENOUS

## 2021-02-21 ENCOUNTER — Other Ambulatory Visit: Payer: Self-pay | Admitting: Family Medicine

## 2021-02-21 DIAGNOSIS — R928 Other abnormal and inconclusive findings on diagnostic imaging of breast: Secondary | ICD-10-CM

## 2021-02-24 ENCOUNTER — Other Ambulatory Visit: Payer: 59

## 2021-03-14 ENCOUNTER — Other Ambulatory Visit: Payer: 59

## 2021-04-04 ENCOUNTER — Other Ambulatory Visit: Payer: 59

## 2021-04-10 ENCOUNTER — Other Ambulatory Visit: Payer: Self-pay | Admitting: General Surgery

## 2021-04-11 LAB — SURGICAL PATHOLOGY

## 2021-05-16 ENCOUNTER — Other Ambulatory Visit: Payer: 59

## 2021-06-08 ENCOUNTER — Encounter: Payer: Self-pay | Admitting: Internal Medicine

## 2021-06-08 ENCOUNTER — Ambulatory Visit
Admission: RE | Admit: 2021-06-08 | Discharge: 2021-06-08 | Disposition: A | Discharge status: Home / Self Care | Payer: Self-pay | Source: Ambulatory Visit | Attending: Family Medicine | Admitting: Family Medicine

## 2021-06-08 DIAGNOSIS — R928 Other abnormal and inconclusive findings on diagnostic imaging of breast: Secondary | ICD-10-CM

## 2021-06-08 MED ORDER — GADOBUTROL 1 MMOL/ML IV SOLN
5.0000 mL | Freq: Once | INTRAVENOUS | Status: AC | PRN
  Administered 2021-06-08: 5 mL via INTRAVENOUS

## 2021-08-01 IMAGING — DX PORTABLE ABDOMEN - 1 VIEW
1 series · 1 of 1 positions shown · non-contrast
Comparison: CT abdomen pelvis from same day. Abdominal x-ray dated
April 25, 2013.

CLINICAL DATA: Enteric tube placement.

EXAM:
PORTABLE ABDOMEN - 1 VIEW

[abdomen supine]
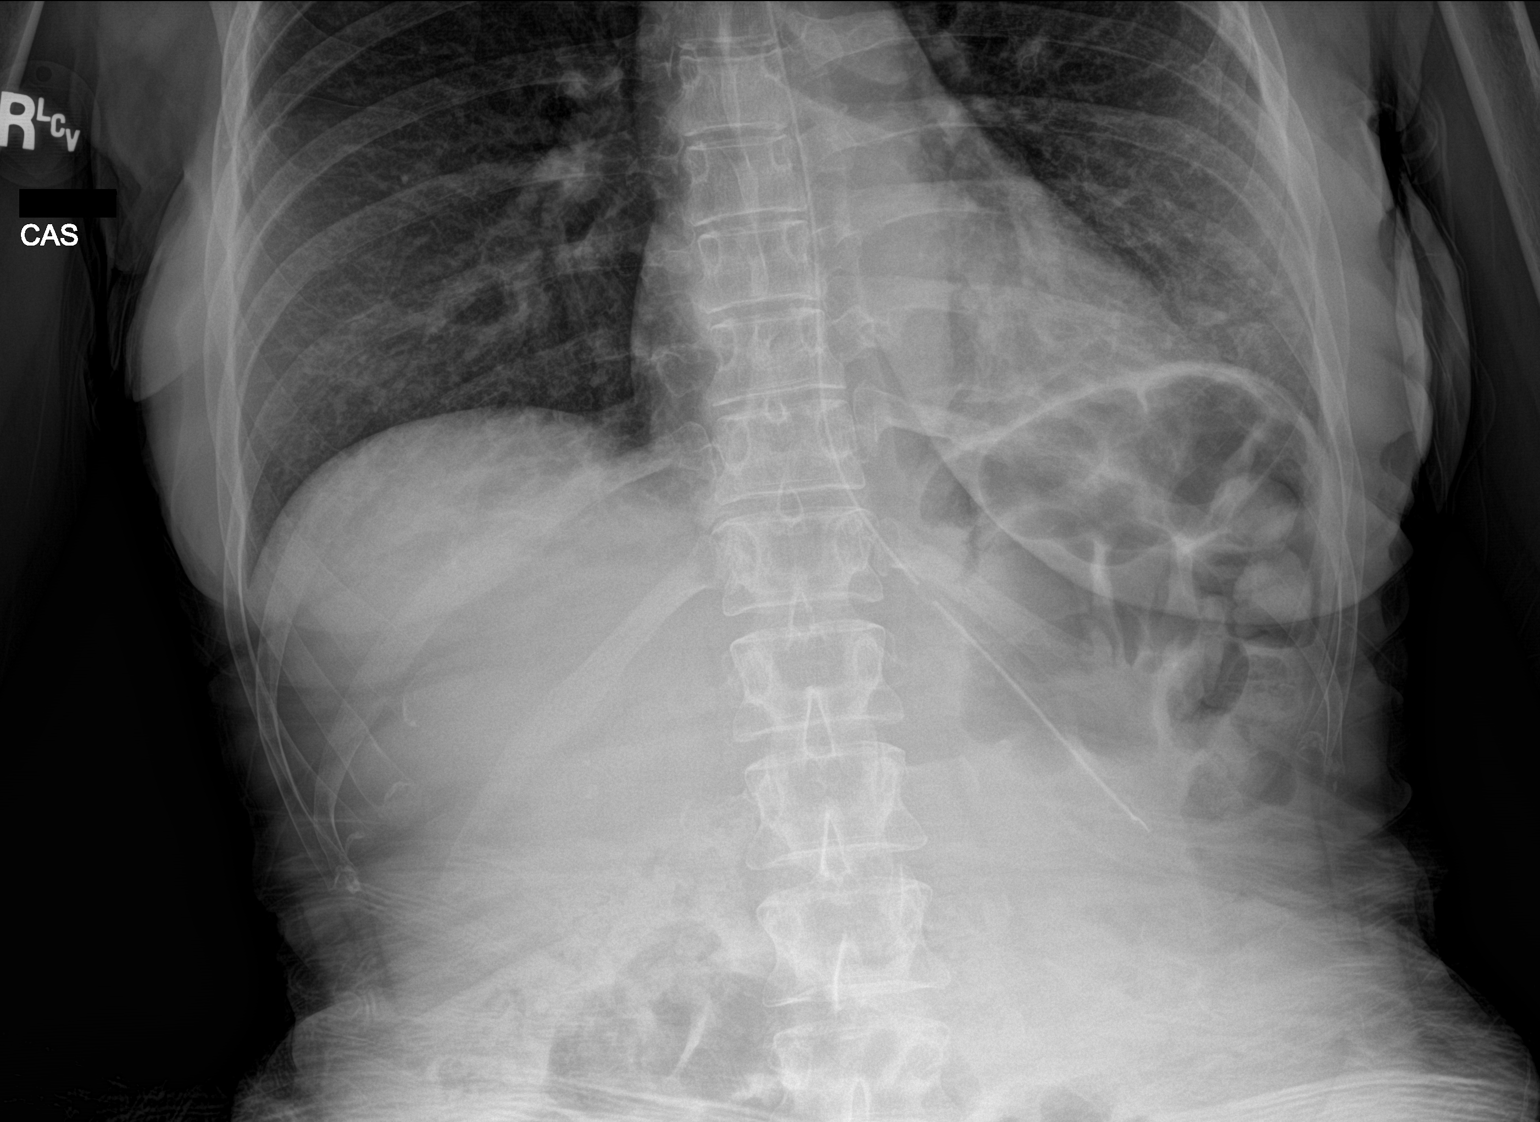

[1 of 1 positions shown; findings below may reference images not displayed]

FINDINGS: Enteric tube within the stomach. The visualized bowel gas pattern is
normal. No radio-opaque calculi or other significant radiographic
abnormality are seen. Left basilar atelectasis.
IMPRESSION: 1. Enteric tube within the stomach.

## 2021-09-20 ENCOUNTER — Ambulatory Visit: Payer: 59

## 2021-09-20 ENCOUNTER — Ambulatory Visit: Payer: 59 | Admitting: Internal Medicine

## 2021-09-20 ENCOUNTER — Other Ambulatory Visit: Payer: 59

## 2021-09-21 ENCOUNTER — Telehealth: Payer: Self-pay | Admitting: Internal Medicine

## 2021-09-21 NOTE — Telephone Encounter (Signed)
pt called in to have appt r/s, pt original date to recieve reclast was 8/2 and has been r/s to 8/8, pt is now requesting appt be moved to a date in september on a tuesday. Please advise

## 2021-09-21 NOTE — Telephone Encounter (Addendum)
OK to r/s as schedule allows.

## 2021-09-26 ENCOUNTER — Ambulatory Visit: Payer: Self-pay

## 2021-09-26 ENCOUNTER — Other Ambulatory Visit: Payer: Self-pay

## 2021-09-26 ENCOUNTER — Ambulatory Visit: Payer: Self-pay | Admitting: Internal Medicine

## 2021-09-29 ENCOUNTER — Other Ambulatory Visit: Payer: Self-pay | Admitting: General Surgery

## 2021-09-29 NOTE — Progress Notes (Signed)
Progress Notes - documented in this encounter Susan Green, Geronimo Boot, MD - 09/28/2021 9:15 AM EDT Formatting of this note is different from the original. Subjective:   Patient ID: Susan Green is a 61 y.o. female.  HPI  The following portions of the patient's history were reviewed and updated as appropriate.  This an established patient is here today for: office visit. Here to discuss having a colonoscopy, last completed 01-06-2013. She states she had 2 days of blood in her stool, August 3 and 4, 2023, none since. The patient brought photos of a large volume of formed stool with bright blood streaked on the external surface. 1 area of clot about the size of a pencil eraser. Photos from 2 days reviewed. No bleeding since that time.  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 Recurrent partial small bowel obstruction August 2020 managed with NG decompression.  She would also like to discuss MRI breast that was completed December 2022 which was repeated in March.  Review of Systems  Constitutional: Negative for chills and fever.  Respiratory: Negative for cough.  Gastrointestinal: Positive for anal bleeding. Negative for constipation and diarrhea.   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 sp right mastectomy and reconstruction  History of chemotherapy 2010  Osteopenia  Partial small bowel obstruction (CMS-HCC) 10/04/2018  Managed without operation.  PONV (postoperative  nausea and vomiting)  Post-surgical hypothyroidism  hypothyroidism due to prior thyroidectomy  Psoriasis  Subungual hematoma of toenail of left foot  Subungual hematoma, left hallux, secondary to trauma from running    Past Surgical History:  Procedure Laterality Date  TONSILLECTOMY 1966  ADENOIDECTOMY 1966  AVM in the brain removed 1998  D&C with two polyps in the uterus removed 2008  INCISIONAL BIOPSY BREAST Right 2010  MASTECTOMY SIMPLE Right 01/25/2009  Port-A-Cath placement 2011  history of breast reconstruction Right 2011  AUGMENTATION MAMMAPLASTY Left 2011  HYSTERECTOMY 2012  total laparoscopic with bilateral salpingo oophorectomy  COLONOSCOPY 01/06/2013  APPENDECTOMY 04/29/2013  Bowel repair N/A 04/30/2013  Laparoscopic appendectomy (incidental early appendicitis) and cecopexy for hypermobile cecum. S.G.Jamal Collin, MD  breast mass excision Right 04/10/2021  KNEE ARTHROSCOPY Bilateral 2005, 2016  meniscus  Removal of right great toenail    OB History   Gravida  0  Para  0  Term  0  Preterm  0  AB  0  Living  0    SAB  0  IAB  0  Ectopic  0  Molar  0  Multiple  0  Live Births  0    Obstetric Comments  Age at first period 67      Social History   Socioeconomic History  Marital status: Married  Spouse name: Darren  Number of children: 0  Years of education: 13  Occupational History  Occupation: Part-time -Therapist, sports  Tobacco Use  Smoking status: Never  Smokeless tobacco: Never  Substance and Sexual Activity  Alcohol use: No  Alcohol/week:  0.0 standard drinks  Drug use: No    Allergies  Allergen Reactions  Codeine Nausea, Rash and Vomiting   Demerol [Meperidine] Nausea  Fosamax [Alendronate] Other (See Comments)  alopecia  Hydromorphone Nausea and Vomiting   Current Outpatient Medications  Medication Sig Dispense Refill  apremilast (OTEZLA) 30 mg tablet Take 30 mg by mouth 2 (two) times daily  b complex vitamins tablet Take by  mouth.  biotin 5 mg Tab Take by mouth  calcium carbonate-vitamin D3 (CALTRATE 600+D) 600 mg(1,530m) -200 unit tablet Take by mouth.  flaxseed oil-omega 3,6,9 1,300 mg-845 mg -117 mg-117 mg Cap Take by mouth 2 (two) times daily.  levothyroxine (SYNTHROID) 100 MCG tablet Take 100 mcg by mouth once daily Takes 2 times a week  levothyroxine (SYNTHROID) 88 MCG tablet Take 88 mcg by mouth 3 (three) times a week Takes 5 times a week Take on an empty stomach with a glass of water at least 30-60 minutes before breakfast.  liothyronine (CYTOMEL) 5 MCG tablet 1/2 tab MWF. Take on an empty stomach with a glass of water at least 30-60 minutes before breakfast. 30 tablet 11  MINOXIDIL TOPICAL Apply topically 2 (two) times daily HM 52 7 % solution  multivitamin capsule Take by mouth  rosuvastatin (CRESTOR) 5 MG tablet TAKE 1/2 TABLET(2.5 MG) BY MOUTH EVERY DAY 45 tablet 1  vitamin E 400 UNIT capsule Take by mouth.   No current facility-administered medications for this visit.   Family History  Problem Relation Age of Onset  High blood pressure (Hypertension) Mother  Heart disease Mother  had 'pauses'  Pacemaker Mother  Diabetes type II Father  Macular degeneration Father  Myocardial Infarction (Heart attack) Father  Coronary Artery Disease (Blocked arteries around heart) Father 724 cabg x4v  Heart failure Father  s/p icd  Cancer Sister 399 vulvar  High blood pressure (Hypertension) Maternal Grandmother  Myocardial Infarction (Heart attack) Maternal Grandfather  Colon cancer Paternal Grandfather 970 Heart disease Paternal Grandfather  Myocardial Infarction (Heart attack) Maternal Uncle  Myocardial Infarction (Heart attack) Paternal Uncle  Stroke Other  High blood pressure (Hypertension) Other  Colon cancer Other  Breast cancer Maternal Aunt 75  Ovarian cancer Neg Hx  Glaucoma Neg Hx   Labs and Radiology:   Colonoscopy January 06, 2013: Normal.   Collected 07/25/2021 08:42     Component Ref Range & Units 2 mo ago  Glucose 70 - 110 mg/dL 87  Sodium 136 - 145 mmol/L 137  Potassium 3.6 - 5.1 mmol/L 4.2  Chloride 97 - 109 mmol/L 100  Carbon Dioxide (CO2) 22.0 - 32.0 mmol/L 29.9  Urea Nitrogen (BUN) 7 - 25 mg/dL 18  Creatinine 0.6 - 1.1 mg/dL 1.0  Glomerular Filtration Rate (eGFR), MDRD Estimate >60 mL/min/1.73sq m 57 Low  Calcium 8.7 - 10.3 mg/dL 9.3  AST 8 - 39 U/L 22  ALT 5 - 38 U/L 17  Alk Phos (alkaline Phosphatase) 34 - 104 U/L 50  Albumin 3.5 - 4.8 g/dL 4.3  Bilirubin, Total 0.3 - 1.2 mg/dL 0.6  Protein, Total 6.1 - 7.9 g/dL 6.9  A/G Ratio 1.0 - 5.0 gm/dL 1.7    BC (White Blood Cell Count) 4.1 - 10.2 10^3/uL 4.4  RBC (Red Blood Cell Count) 4.04 - 5.48 10^6/uL 5.00  Hemoglobin 12.0 - 15.0 gm/dL 15.1 High  Hematocrit 35.0 - 47.0 % 46.4  MCV (Mean Corpuscular Volume) 80.0 - 100.0 fl 92.8  MCH (Mean Corpuscular Hemoglobin) 27.0 - 31.2 pg 30.2  MCHC (Mean Corpuscular Hemoglobin Concentration) 32.0 - 36.0 gm/dL 32.5  Platelet Count 150 - 450 10^3/uL 185  RDW-CV (Red Cell Distribution Width) 11.6 - 14.8 % 12.2  MPV (Mean Platelet Volume) 9.4 - 12.4 fl 11.2  Neutrophils 1.50 - 7.80 10^3/uL 2.70  Lymphocytes 1.00 - 3.60 10^3/uL 1.20  Mixed Count 0.10 - 0.90 10^3/uL 0.50  Neutrophil % 32.0 - 70.0 % 61.1  Lymphocyte % 10.0 - 50.0 % 27.0  Mixed % 3.0 - 14.4 % 11.9   Breast MRI February 16, 2021,:  IMPRESSION: 1. No evidence of malignancy. 2. The previously described 4 mm medial right breast mass is shown to have represented a stable pair of normal blood vessels.  RECOMMENDATION: Left screening mammogram unless the patient has had a left mammogram elsewhere since September 2021.    Objective:  Physical Exam Exam conducted with a chaperone present.  Constitutional:  Appearance: Normal appearance.  Cardiovascular:  Rate and Rhythm: Normal rate and regular rhythm.  Pulses: Normal pulses.  Heart sounds: Normal heart sounds.  Pulmonary:   Effort: Pulmonary effort is normal.  Breath sounds: Normal breath sounds.  Musculoskeletal:  Cervical back: Neck supple.  Skin: General: Skin is warm and dry.  Neurological:  Mental Status: She is alert and oriented to person, place, and time.  Psychiatric:  Mood and Affect: Mood normal.  Behavior: Behavior normal.    Assessment:   Rectal bleeding, likely anorectal source.  Second-degree relatives with history of colon cancer in his 30s.  Plan:   Patient will be a candidate for screening colonoscopy next year. In light of the episode of rectal bleeding, it is reasonable to proceed with the procedure at this time.  Risks and benefits reviewed.  Breast MRI results reviewed. Further follow-up as arranged by her PCP.    This note is partially prepared by Karie Fetch, RN, acting as a scribe in the presence of Dr. Hervey Ard, MD.  The documentation recorded by the scribe accurately reflects the service I personally performed and the decisions made by me.   Robert Bellow, MD FACS  Electronically signed by Mayer Masker, MD at 09/28/2021 11:45 AM EDT

## 2021-10-26 ENCOUNTER — Telehealth: Payer: Self-pay | Admitting: *Deleted

## 2021-10-26 NOTE — Telephone Encounter (Signed)
Patient called stating that she has an appointment next week for Reclast. Her concern is that she had alopecia with Fosamax in 2015 and she had hair loss after her father passed away 2022-01-26and she has undergone treatment for hair loss. She is asking of Reclast is going to cause alopecia an dif there is another medicine that can be used that will not affect her hair Please advise

## 2021-10-27 ENCOUNTER — Encounter: Payer: Self-pay | Admitting: Internal Medicine

## 2021-10-27 NOTE — Telephone Encounter (Signed)
Pt.notified

## 2021-10-30 ENCOUNTER — Other Ambulatory Visit: Payer: Self-pay

## 2021-10-30 DIAGNOSIS — Z170 Estrogen receptor positive status [ER+]: Secondary | ICD-10-CM

## 2021-10-31 ENCOUNTER — Encounter: Payer: Self-pay | Admitting: Internal Medicine

## 2021-10-31 ENCOUNTER — Inpatient Hospital Stay: Payer: 59

## 2021-10-31 ENCOUNTER — Inpatient Hospital Stay (HOSPITAL_BASED_OUTPATIENT_CLINIC_OR_DEPARTMENT_OTHER): Payer: 59 | Admitting: Internal Medicine

## 2021-10-31 ENCOUNTER — Inpatient Hospital Stay: Payer: 59 | Attending: Internal Medicine

## 2021-10-31 ENCOUNTER — Other Ambulatory Visit: Payer: Self-pay

## 2021-10-31 DIAGNOSIS — M8580 Other specified disorders of bone density and structure, unspecified site: Secondary | ICD-10-CM | POA: Insufficient documentation

## 2021-10-31 DIAGNOSIS — E876 Hypokalemia: Secondary | ICD-10-CM | POA: Diagnosis not present

## 2021-10-31 DIAGNOSIS — C50811 Malignant neoplasm of overlapping sites of right female breast: Secondary | ICD-10-CM | POA: Diagnosis not present

## 2021-10-31 DIAGNOSIS — D751 Secondary polycythemia: Secondary | ICD-10-CM | POA: Insufficient documentation

## 2021-10-31 DIAGNOSIS — Z9011 Acquired absence of right breast and nipple: Secondary | ICD-10-CM | POA: Insufficient documentation

## 2021-10-31 DIAGNOSIS — Z853 Personal history of malignant neoplasm of breast: Secondary | ICD-10-CM | POA: Insufficient documentation

## 2021-10-31 DIAGNOSIS — Z170 Estrogen receptor positive status [ER+]: Secondary | ICD-10-CM

## 2021-10-31 DIAGNOSIS — L659 Nonscarring hair loss, unspecified: Secondary | ICD-10-CM | POA: Insufficient documentation

## 2021-10-31 LAB — COMPREHENSIVE METABOLIC PANEL
ALT: 20 U/L (ref 0–44)
AST: 32 U/L (ref 15–41)
Albumin: 4.5 g/dL (ref 3.5–5.0)
Alkaline Phosphatase: 47 U/L (ref 38–126)
Anion gap: 10 (ref 5–15)
BUN: 30 mg/dL — ABNORMAL HIGH (ref 6–20)
CO2: 25 mmol/L (ref 22–32)
Calcium: 9.1 mg/dL (ref 8.9–10.3)
Chloride: 102 mmol/L (ref 98–111)
Creatinine, Ser: 1.09 mg/dL — ABNORMAL HIGH (ref 0.44–1.00)
GFR, Estimated: 59 mL/min — ABNORMAL LOW (ref >60)
Glucose, Bld: 97 mg/dL (ref 70–99)
Potassium: 3.1 mmol/L — ABNORMAL LOW (ref 3.5–5.1)
Sodium: 137 mmol/L (ref 135–145)
Total Bilirubin: 0.7 mg/dL (ref 0.3–1.2)
Total Protein: 7.8 g/dL (ref 6.5–8.1)

## 2021-10-31 LAB — CBC WITH DIFFERENTIAL/PLATELET
Abs Immature Granulocytes: 0.12 10*3/uL — ABNORMAL HIGH (ref 0.00–0.07)
Basophils Absolute: 0.1 10*3/uL (ref 0.0–0.1)
Basophils Relative: 1 %
Eosinophils Absolute: 0.1 10*3/uL (ref 0.0–0.5)
Eosinophils Relative: 1 %
HCT: 47 % — ABNORMAL HIGH (ref 36.0–46.0)
Hemoglobin: 15.3 g/dL — ABNORMAL HIGH (ref 12.0–15.0)
Immature Granulocytes: 1 %
Lymphocytes Relative: 15 %
Lymphs Abs: 1.2 10*3/uL (ref 0.7–4.0)
MCH: 30.2 pg (ref 26.0–34.0)
MCHC: 32.6 g/dL (ref 30.0–36.0)
MCV: 92.9 fL (ref 80.0–100.0)
Monocytes Absolute: 0.5 10*3/uL (ref 0.1–1.0)
Monocytes Relative: 6 %
Neutro Abs: 6.4 10*3/uL (ref 1.7–7.7)
Neutrophils Relative %: 76 %
Platelets: 187 10*3/uL (ref 150–400)
RBC: 5.06 MIL/uL (ref 3.87–5.11)
RDW: 13 % (ref 11.5–15.5)
WBC: 8.4 10*3/uL (ref 4.0–10.5)
nRBC: 0 % (ref 0.0–0.2)

## 2021-10-31 LAB — VITAMIN D 25 HYDROXY (VIT D DEFICIENCY, FRACTURES): Vit D, 25-Hydroxy: 89.3 ng/mL (ref 30–100)

## 2021-10-31 NOTE — Progress Notes (Signed)
[facility] OFFICE PROGRESS NOTE  Patient Care Team: Derinda Late, MD as PCP - General (Family Medicine) Bary Castilla, Forest Gleason, MD as Consulting Physician (General Surgery) Derinda Late, MD (Family Medicine) Cammie Sickle, MD as Consulting Physician (Hematology and Oncology)   Cancer Staging  No matching staging information was found for the patient.   Oncology History Overview Note  carcinoma of breast, diagnosis.  In February of 2011.  Right breast.  ER positive.  PR positive, HER-2/neu, and not overexpressed s/p Right mastec  Status post 4 cycles of chemotherapy with Cytoxan, and Taxotere Finished chemotherapy on 07/07/2009  Status post bilateral   oopherectomy  and  hysterectomy in February of 2012   Tamoxifen June of 2011   # Genetcis- delcines  # Osteopenia- hair loss sec to Foaomax   Carcinoma of overlapping 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, estrogen receptor positive ([place])  08/24/2016 Initial Diagnosis   Carcinoma of overlapping 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, estrogen receptor positive ([place])       INTERVAL HISTORY: Alone.  Ambulating independently.  Susan Green 61 y.o.  female pleasant patient above history of breast cancer is here for follow-up.   Patient is quite stressed because of her family/social issues.  However she denies any new lumps or bumps.  Appetite is good.  No weight loss.  She continues to be physically active.   Review of Systems  Constitutional:  Negative for chills, diaphoresis, fever, malaise/fatigue and weight loss.  HENT:  Negative for nosebleeds and sore throat.   Eyes:  Negative for double vision.  Respiratory:  Negative for cough, hemoptysis, sputum production, shortness of breath and wheezing.   Cardiovascular:  Negative for chest pain, palpitations, orthopnea and leg swelling.  Gastrointestinal:  Negative for abdominal pain, blood in stool, constipation, diarrhea, heartburn, melena, nausea and vomiting.   Genitourinary:  Negative for dysuria, frequency and urgency.  Musculoskeletal:  Negative for back pain and joint pain.  Skin: Negative.  Negative for itching and rash.  Neurological:  Negative for dizziness, tingling, focal weakness, weakness and headaches.  Endo/Heme/Allergies:  Does not bruise/bleed easily.  Psychiatric/Behavioral:  Negative for depression. The patient is not nervous/anxious and does not have insomnia.       PAST MEDICAL HISTORY :  Past Medical History:  Diagnosis Date   174.9 2011   Right breast T1c,N1 (mic),ER 90%; PR 90%, Her 2 neu not over expressed treated with chemotherapy and reconstruction   Breast cancer ([place]) 01/2009   Right with Chemo   Cancer ([place])    Personal history of chemotherapy 2010   BREAST CA   PONV (postoperative nausea and vomiting)    very naseated   Psoriasis    Thyroid disease    hypothyroidism    PAST SURGICAL HISTORY :   Past Surgical History:  Procedure Laterality Date   APPENDECTOMY  04/29/13   APPENDECTOMY     AUGMENTATION MAMMAPLASTY Bilateral 08/2009   Reconstruction   BREAST BIOPSY Right 2010   Positive   BREAST RECONSTRUCTION  2011, 2012   3   BREAST RECONSTRUCTION Right 2011   BREAST SURGERY Right 01/25/2009   mastectomy   BREAST SURGERY Left 2011   made to match the right breast   BREAST SURGERY     cecopexy  04-29-13   CRANIOTOMY FOR AVM  [place]  2008   polyp   KNEE ARTHROSCOPY Right 2005   KNEE ARTHROSCOPY Left 09/22/2014  Procedure: Left knee arthroscopy, partial medial menisectomy;  Surgeon: Dereck Leep, MD;  Location: [facility] ORS;  Service: Orthopedics;  Laterality: Left;   KNEE ARTHROSCOPY Bilateral    MASTECTOMY Right 2010   BREAST CA   PORTACATH PLACEMENT  2011   TONSILLECTOMY AND ADENOIDECTOMY  1966   TOTAL LAPAROSCOPIC HYSTERECTOMY WITH BILATERAL SALPINGO OOPHORECTOMY      FAMILY HISTORY :   Family History  Problem Relation Age of Onset   Healthy Mother     Healthy Father    Diabetes Father    Heart disease Father    Colon cancer Paternal Grandfather 75   Heart disease Paternal Grandfather    Breast cancer Maternal Aunt 75   Cancer - Other Sister 106       Vulvar   Hypertension Maternal Grandmother     SOCIAL HISTORY:   Social History   Tobacco Use   Smoking status: Never   Smokeless tobacco: Never  Vaping Use   Vaping Use: Never used  Substance Use Topics   Alcohol use: Never   Drug use: Never    ALLERGIES:  is allergic to dilaudid [hydromorphone hcl], alendronate, codeine, and demerol [meperidine].  MEDICATIONS:  Current Outpatient Medications  Medication Sig Dispense Refill   Apremilast (OTEZLA) 30 MG TABS Take 1 tablet by mouth daily.      b complex vitamins tablet Take 2 tablets by mouth daily.     Biotin 5000 MCG TABS Take 1 tablet by mouth.     CALCIUM-MAGNESIUM PO Take 1 tablet by mouth.     Calcium-Vitamin D 600-200 MG-UNIT per tablet Take 1 tablet by mouth daily.      conjugated estrogens (PREMARIN) vaginal cream Place 1 Applicatorful vaginally 2 (two) times a week. 1 gram vaginally at bedtime twice weekly 30 g 3   Flaxseed, Linseed, (FLAX SEED OIL) 1300 MG CAPS Take 2 capsules by mouth daily with lunch.     levothyroxine (SYNTHROID) 100 MCG tablet Take 1 tablet by mouth 2 (two) times a week.     levothyroxine (SYNTHROID) 88 MCG tablet Take 1 tablet by mouth 3 (three) times a week.     liothyronine (CYTOMEL) 5 MCG tablet 1/2 tab MWF. Take on an empty stomach with a glass of water at least 30-60 minutes before breakfast.     rosuvastatin (CRESTOR) 5 MG tablet Take 0.5 tablets by mouth daily.     vitamin E 400 UNIT capsule Take 400 Units by mouth daily.     No current facility-administered medications for this visit.    PHYSICAL EXAMINATION: ECOG PERFORMANCE STATUS: 0 - Asymptomatic  Right and left BREAST exam [in the presence of nurse]-right mastectomy with implant noted.  Left breast -implant noted; no unusual  skin changes or dominant masses felt. Surgical scars noted.    BP 111/74 (BP Location: Left Arm, Patient Position: Sitting, Cuff Size: Normal)   Pulse 69   Temp (!) 96.4 F (35.8 C) (Tympanic)   Ht '5\' 3"'  (1.6 m)   Wt 134 lb 6.4 oz (61 kg)   SpO2 98%   BMI 23.81 kg/m   Filed Weights   10/31/21 1448  Weight: 134 lb 6.4 oz (61 kg)    Physical Exam HENT:     Head: Normocephalic and atraumatic.     Mouth/Throat:     Pharynx: No oropharyngeal exudate.  Eyes:     Pupils: Pupils are equal, round, and reactive to light.  Cardiovascular:     Rate and Rhythm: Normal  rate and regular rhythm.  Pulmonary:     Effort: No respiratory distress.     Breath sounds: No wheezing.  Abdominal:     General: Bowel sounds are normal. There is no distension.     Palpations: Abdomen is soft. There is no mass.     Tenderness: There is no abdominal tenderness. There is no guarding or rebound.  Musculoskeletal:        General: No tenderness. Normal range of motion.     Cervical back: Normal range of motion and neck supple.  Skin:    General: Skin is warm.     Comments: Breast exam  [in presence of nurse] right breast mastectomy ; implant-no lumps or bumps.  Left breast no unusual skin changes or dominant masses felt-implant in place   Neurological:     Mental Status: She is alert and oriented to person, place, and time.  Psychiatric:        Mood and Affect: Affect normal.    LABORATORY DATA:  I have reviewed the data as listed    Component Value Date/Time   NA 137 10/31/2021 1456   NA 140 01/27/2014 1538   K 3.1 (L) 10/31/2021 1456   K 3.7 01/27/2014 1538   CL 102 10/31/2021 1456   CL 103 01/27/2014 1538   CO2 25 10/31/2021 1456   CO2 34 (H) 01/27/2014 1538   GLUCOSE 97 10/31/2021 1456   GLUCOSE 140 (H) 01/27/2014 1538   BUN 30 (H) 10/31/2021 1456   BUN 18 01/27/2014 1538   CREATININE 1.09 (H) 10/31/2021 1456   CREATININE 1.02 01/27/2014 1538   CALCIUM 9.1 10/31/2021 1456   CALCIUM  8.4 (L) 01/27/2014 1538   PROT 7.8 10/31/2021 1456   PROT 6.7 01/27/2014 1538   ALBUMIN 4.5 10/31/2021 1456   ALBUMIN 3.4 01/27/2014 1538   AST 32 10/31/2021 1456   AST 22 01/27/2014 1538   ALT 20 10/31/2021 1456   ALT 22 01/27/2014 1538   ALKPHOS 47 10/31/2021 1456   ALKPHOS 49 01/27/2014 1538   BILITOT 0.7 10/31/2021 1456   BILITOT 0.3 01/27/2014 1538   GFRNONAA 59 (L) 10/31/2021 1456   GFRNONAA >60 01/27/2014 1538   GFRNONAA >60 05/22/2013 1212   GFRAA >60 09/22/2019 0921   GFRAA >60 01/27/2014 1538   GFRAA >60 05/22/2013 1212    No results found for: "SPEP", "UPEP"  Lab Results  Component Value Date   WBC 8.4 10/31/2021   NEUTROABS 6.4 10/31/2021   HGB 15.3 (H) 10/31/2021   HCT 47.0 (H) 10/31/2021   MCV 92.9 10/31/2021   PLT 187 10/31/2021      Chemistry      Component Value Date/Time   NA 137 10/31/2021 1456   NA 140 01/27/2014 1538   K 3.1 (L) 10/31/2021 1456   K 3.7 01/27/2014 1538   CL 102 10/31/2021 1456   CL 103 01/27/2014 1538   CO2 25 10/31/2021 1456   CO2 34 (H) 01/27/2014 1538   BUN 30 (H) 10/31/2021 1456   BUN 18 01/27/2014 1538   CREATININE 1.09 (H) 10/31/2021 1456   CREATININE 1.02 01/27/2014 1538      Component Value Date/Time   CALCIUM 9.1 10/31/2021 1456   CALCIUM 8.4 (L) 01/27/2014 1538   ALKPHOS 47 10/31/2021 1456   ALKPHOS 49 01/27/2014 1538   AST 32 10/31/2021 1456   AST 22 01/27/2014 1538   ALT 20 10/31/2021 1456   ALT 22 01/27/2014 1538   BILITOT 0.7 10/31/2021  1456   BILITOT 0.3 01/27/2014 1538       RADIOGRAPHIC STUDIES: I have personally reviewed the radiological images as listed and agreed with the findings in the report. No results found.   ASSESSMENT & PLAN:  Carcinoma of overlapping 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, estrogen receptor positive ([place]) # STAGE I -right breast ER/PR positive, HER-2 not overexpressed. finished tamoxifen sep 2017; under active surveillance.  Stable.  Clinically no evidence of recurrence.   Status post right mastectomy; Left mastopexy/implant-recommend screening mammogram of the left side  # Dexa Scan: Done in SEP 2020- T score -2.1. Continue calcium and vitamin D/exercise. Declines bisphosphonates given risk/concern of hair loss.  We will repeat bone density along with a mammogram in 1 to 2 weeks.  Check vitamin D levels.  # Mild Erythrocytosis: JAK-2 NEGATIVE; suspect secondary.  Monitor for now.  #Hair loss-s/p evaluation with dermatology/awaiting plasma replacement therapy.  Defer to dermatology/plastic surgery.  # Hypokalemia- 3.1- no medications; discussed re: dietary supplements.   # DISPOSITION: #  print out labs # Left screening mammogram & BMD in next 1-2 weeks   # NO REclast  # follow up in 12 months; MD-labs- cbc/cmp ca-27-29; vit D 25- [place] levels;.-Dr.B   Orders Placed This Encounter  Procedures   MM 3D SCREEN BREAST UNI LEFT    Standing Status:   Future    Standing Expiration Date:   11/01/2022    Order Specific Question:   Reason for Exam (SYMPTOM  OR DIAGNOSIS REQUIRED)    Answer:   hx breast cancer    Order Specific Question:   Is the patient pregnant?    Answer:   No    Order Specific Question:   Preferred imaging location?    Answer:   [facility]   DG Bone Density    Standing Status:   Future    Standing Expiration Date:   10/31/2022    Order Specific Question:   Reason for Exam (SYMPTOM  OR DIAGNOSIS REQUIRED)    Answer:   hx breast cancer    Order Specific Question:   Is the patient pregnant?    Answer:   No    Order Specific Question:   Preferred imaging location?    Answer:   [facility]   VITAMIN D 25 Hydroxy (Vit-D Deficiency, Fractures)    Standing Status:   Future    Standing Expiration Date:   11/01/2022   CBC with Differential/Platelet    Standing Status:   Future    Standing Expiration Date:   11/01/2022   Comprehensive metabolic panel    Standing Status:   Future    Standing Expiration Date:   11/01/2022   Cancer  antigen 27.29    Standing Status:   Future    Standing Expiration Date:   11/01/2022   VITAMIN D 25 Hydroxy (Vit-D Deficiency, Fractures)    Standing Status:   Future    Number of Occurrences:   1    Standing Expiration Date:   11/01/2022   All questions were answered. The patient knows to call the clinic with any problems, questions or concerns.      Cammie Sickle, MD 10/31/2021 8:44 PM

## 2021-10-31 NOTE — Progress Notes (Signed)
Found a knot right breast/Dr. Tollie Pizza found abscess/removed.  Colonoscopy next week.  Has questions about reclast.

## 2021-10-31 NOTE — Assessment & Plan Note (Addendum)
#  STAGE I -right breast ER/PR positive, HER-2 not overexpressed. finished tamoxifen sep 2017; under active surveillance.  Stable.  Clinically no evidence of recurrence.  Status post right mastectomy; Left mastopexy/implant-recommend screening mammogram of the left side  # Dexa Scan: Done in SEP 2020- T score -2.1. Continue calcium and vitamin D/exercise. Declines bisphosphonates given risk/concern of hair loss.  We will repeat bone density along with a mammogram in 1 to 2 weeks.  Check vitamin D levels.  # Mild Erythrocytosis: JAK-2 NEGATIVE; suspect secondary.  Monitor for now.  #Hair loss-s/p evaluation with dermatology/awaiting plasma replacement therapy.  Defer to dermatology/plastic surgery.  # Hypokalemia- 3.1- no medications; discussed re: dietary supplements.   # DISPOSITION: #  print out labs # Left screening mammogram & BMD in next 1-2 weeks   # NO REclast  # follow up in 12 months; MD-labs- cbc/cmp ca-27-29; vit D 25- [place] levels;.-Dr.B

## 2021-11-01 LAB — CANCER ANTIGEN 27.29: CA 27.29: 19.2 U/mL (ref 0.0–38.6)

## 2021-11-02 ENCOUNTER — Telehealth: Payer: Self-pay

## 2021-11-02 NOTE — Telephone Encounter (Signed)
-----   Message from Cammie Sickle, MD sent at 11/01/2021 11:45 PM EDT ----- Please inform patient that her vitamin D levels are normal.  She continue take current vitamin D/calcium.  No new recommendations follow-up as planned  GB

## 2021-11-02 NOTE — Telephone Encounter (Signed)
Pt.notified

## 2021-11-08 ENCOUNTER — Ambulatory Visit
Admission: RE | Admit: 2021-11-08 | Discharge: 2021-11-08 | Disposition: A | Discharge status: Home / Self Care | Payer: 59 | Attending: General Surgery | Admitting: General Surgery

## 2021-11-08 ENCOUNTER — Ambulatory Visit: Payer: 59 | Admitting: Anesthesiology

## 2021-11-08 ENCOUNTER — Encounter
Admission: RE | Disposition: A | Discharge status: Home / Self Care | Payer: Self-pay | Source: Home / Self Care | Attending: General Surgery

## 2021-11-08 DIAGNOSIS — Q438 Other specified congenital malformations of intestine: Secondary | ICD-10-CM | POA: Insufficient documentation

## 2021-11-08 DIAGNOSIS — K625 Hemorrhage of anus and rectum: Secondary | ICD-10-CM | POA: Insufficient documentation

## 2021-11-08 DIAGNOSIS — Z9049 Acquired absence of other specified parts of digestive tract: Secondary | ICD-10-CM | POA: Diagnosis not present

## 2021-11-08 HISTORY — PX: COLONOSCOPY WITH PROPOFOL: SHX5780

## 2021-11-08 SURGERY — COLONOSCOPY WITH PROPOFOL
Anesthesia: Monitor Anesthesia Care

## 2021-11-08 MED ORDER — SODIUM CHLORIDE 0.9 % IV SOLN
INTRAVENOUS | Status: DC
  Administered 2021-11-08: 20 mL/h via INTRAVENOUS

## 2021-11-08 MED ORDER — PROPOFOL 500 MG/50ML IV EMUL
INTRAVENOUS | Status: DC | PRN
  Administered 2021-11-08: 130 ug/kg/min via INTRAVENOUS

## 2021-11-08 MED ORDER — PROPOFOL 10 MG/ML IV BOLUS
INTRAVENOUS | Status: DC | PRN
  Administered 2021-11-08: 80 mg via INTRAVENOUS

## 2021-11-08 MED ORDER — LIDOCAINE HCL (CARDIAC) PF 100 MG/5ML IV SOSY
PREFILLED_SYRINGE | INTRAVENOUS | Status: DC | PRN
  Administered 2021-11-08: 50 mg via INTRAVENOUS

## 2021-11-08 MED ORDER — ONDANSETRON HCL 4 MG/2ML IJ SOLN
INTRAMUSCULAR | Status: DC | PRN
  Administered 2021-11-08: 4 mg via INTRAVENOUS

## 2021-11-08 NOTE — Op Note (Signed)
[facility] Patient Name: Susan Green Procedure Date: 11/08/2021 10:45 AM MRN: [id number] Account #: [id number] Date of Birth: 1960/11/03 Admit Type: Outpatient Age: 61 Room: [facility] ENDO ROOM 1 Gender: Female Note Status: Finalized Instrument Name: Peds Colonoscope [id number] Procedure:             Colonoscopy Indications:           Anal bleeding Providers:             Robert Bellow, MD Referring MD:          Caprice Renshaw MD (Referring MD) Medicines:             Propofol per Anesthesia Complications:         No immediate complications. Procedure:             Pre-Anesthesia Assessment:                        - Prior to the procedure, a History and Physical was                         performed, and patient medications, allergies and                         sensitivities were reviewed. The patient's tolerance                         of previous anesthesia was reviewed.                        - The risks and benefits of the procedure and the                         sedation options and risks were discussed with the                         patient. All questions were answered and informed                         consent was obtained.                        After obtaining informed consent, the colonoscope was                         passed under direct vision. Throughout the procedure,                         the patient's blood pressure, pulse, and oxygen                         saturations were monitored continuously. The                         Colonoscope was introduced through the anus and                         advanced to the the cecum, identified by appendiceal  orifice and ileocecal valve. The colonoscopy was                         somewhat difficult due to a tortuous colon. Successful                         completion of the procedure was aided by using manual                         pressure. The patient  tolerated the procedure well.                         The quality of the bowel preparation was excellent. Findings:      The entire examined colon appeared normal on direct and retroflexion       views. Impression:            - The entire examined colon is normal on direct and                         retroflexion views.                        - No specimens collected. Recommendation:        - Repeat colonoscopy in 10 years for screening                         purposes. Procedure Code(s):     --- Professional ---                        [phone], Colonoscopy, flexible; diagnostic, including                         collection of specimen(s) by brushing or washing, when                         performed (separate procedure) Diagnosis Code(s):     --- Professional ---                        K62.5, Hemorrhage of anus and rectum CPT copyright 2019 American Medical Association. All rights reserved. The codes documented in this report are preliminary and upon coder review may  be revised to meet current compliance requirements. Robert Bellow, MD 11/08/2021 11:26:38 AM This report has been signed electronically. Number of Addenda: 0 Note Initiated On: 11/08/2021 10:45 AM Scope Withdrawal Time: 0 hours 11 minutes 25 seconds  Total Procedure Duration: 0 hours 26 minutes 22 seconds  Estimated Blood Loss:  Estimated blood loss: none.      [facility]

## 2021-11-08 NOTE — Anesthesia Postprocedure Evaluation (Signed)
Anesthesia Post Note  Patient: Susan Green  Procedure(s) Performed: COLONOSCOPY WITH PROPOFOL  Patient location during evaluation: PACU Anesthesia Type: MAC Level of consciousness: awake and alert Pain management: pain level controlled Vital Signs Assessment: post-procedure vital signs reviewed and stable Respiratory status: spontaneous breathing, nonlabored ventilation, respiratory function stable and patient connected to nasal cannula oxygen Cardiovascular status: stable and blood pressure returned to baseline Postop Assessment: no apparent nausea or vomiting Anesthetic complications: no   No notable events documented.   Last Vitals:  Vitals:   11/08/21 1153 11/08/21 1200  BP: 123/74 (!) 127/59  Pulse: (!) 59 (!) 49  Resp: 12 15  Temp:    SpO2: 99% 100%    Last Pain:  Vitals:   11/08/21 1127  TempSrc: Temporal  PainSc: Asleep                 Tonny Bollman

## 2021-11-08 NOTE — H&P (Signed)
Susan Green [id number] [birth year]     HPI:  61 y/o with a history of two days of rectal bleeding in August, none since.  Normal colonoscopy in 2014.  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    Medications Prior to Admission  Medication Sig Dispense Refill Last Dose   Apremilast (OTEZLA) 30 MG TABS Take 1 tablet by mouth daily.    11/07/2021   b complex vitamins tablet Take 2 tablets by mouth daily.   Past Week   Biotin 5000 MCG TABS Take 1 tablet by mouth.   Past Week   CALCIUM-MAGNESIUM PO Take 1 tablet by mouth.   Past Week   Calcium-Vitamin D 600-200 MG-UNIT per tablet Take 1 tablet by mouth daily.    Past Week   conjugated estrogens (PREMARIN) vaginal cream Place 1 Applicatorful vaginally 2 (two) times a week. 1 gram vaginally at bedtime twice weekly 30 g 3 11/07/2021   Flaxseed, Linseed, (FLAX SEED OIL) 1300 MG CAPS Take 2 capsules by mouth daily with lunch.   Past Week   levothyroxine (SYNTHROID) 100 MCG tablet Take 1 tablet by mouth 2 (two) times a week.   11/07/2021   levothyroxine (SYNTHROID) 88 MCG tablet Take 1 tablet by mouth 3 (three) times a week.   11/07/2021   liothyronine (CYTOMEL) 5 MCG tablet 1/2 tab MWF. Take on an empty stomach with a glass of water at least 30-60 minutes before breakfast.   11/07/2021   rosuvastatin (CRESTOR) 5 MG tablet Take 0.5 tablets by mouth daily.   11/07/2021   vitamin E 400 UNIT capsule Take 400 Units by mouth daily.   Past Week   Allergies  Allergen Reactions   Dilaudid [Hydromorphone Hcl] Nausea And Vomiting   Alendronate     Other reaction(s): Other (See Comments) alopecia   Codeine Nausea And Vomiting   Demerol [Meperidine] Nausea Only   Past Medical History:  Diagnosis Date   174.9 2011   Right breast T1c,N1 (mic),ER 90%; PR 90%, Her 2 neu not over expressed treated with chemotherapy and reconstruction   Breast cancer ([place]) 01/2009   Right with Chemo   Cancer ([place])    Personal history of chemotherapy 2010    BREAST CA   PONV (postoperative nausea and vomiting)    very naseated   Psoriasis    Thyroid disease    hypothyroidism   Past Surgical History:  Procedure Laterality Date   APPENDECTOMY  04/29/13   APPENDECTOMY     AUGMENTATION MAMMAPLASTY Bilateral 08/2009   Reconstruction   BREAST BIOPSY Right 2010   Positive   BREAST RECONSTRUCTION  2011, 2012   3   BREAST RECONSTRUCTION Right 2011   BREAST SURGERY Right 01/25/2009   mastectomy   BREAST SURGERY Left 2011   made to match the right breast   BREAST SURGERY     cecopexy  04-29-13   CRANIOTOMY FOR AVM  [place] OF UTERUS  2008   polyp   KNEE ARTHROSCOPY Right 2005   KNEE ARTHROSCOPY Left 09/22/2014   Procedure: Left knee arthroscopy, partial medial menisectomy;  Surgeon: Dereck Leep, MD;  Location: [facility] ORS;  Service: Orthopedics;  Laterality: Left;   KNEE ARTHROSCOPY Bilateral    MASTECTOMY Right 2010   BREAST CA   PORTACATH PLACEMENT  2011   TONSILLECTOMY AND ADENOIDECTOMY  1966   TOTAL LAPAROSCOPIC HYSTERECTOMY WITH BILATERAL SALPINGO OOPHORECTOMY     Social History   Socioeconomic History  Marital status: Married    Spouse name: Not on file   Number of children: Not on file   Years of education: Not on file   Highest education level: Not on file  Occupational History   Not on file  Tobacco Use   Smoking status: Never   Smokeless tobacco: Never  Vaping Use   Vaping Use: Never used  Substance and Sexual Activity   Alcohol use: Never   Drug use: Never   Sexual activity: Yes    Birth control/protection: Surgical    Comment: Hysterectomy  Other Topics Concern   Not on file  Social History Narrative   ** Merged History Encounter **       Social Determinants of Health   Financial Resource Strain: Not on file  Food Insecurity: Not on file  Transportation Needs: Not on file  Physical Activity: Not on file  Stress: Not on file  Social Connections: Not on file  Intimate Partner  Violence: Not on file   Social History   Social History Narrative   ** Merged History Encounter **         ROS: Negative.     PE: HEENT: Negative. Lungs: Clear. Cardio: RR.   Assessment/Plan:  Proceed with planned endoscopy.    Forest Gleason [facility] 11/08/2021

## 2021-11-08 NOTE — Anesthesia Preprocedure Evaluation (Addendum)
Anesthesia Evaluation  Patient identified by MRN, date of birth, ID band Patient awake    Reviewed: Allergy & Precautions, NPO status , Patient's Chart, lab work & pertinent test results  History of Anesthesia Complications (+) PONV and history of anesthetic complications  Airway Mallampati: I  TM Distance: >3 FB Neck ROM: Full    Dental no notable dental hx.    Pulmonary neg pulmonary ROS,    Pulmonary exam normal breath sounds clear to auscultation       Cardiovascular Exercise Tolerance: Good METS: 3 - Mets negative cardio ROS Normal cardiovascular exam Rhythm:Regular Rate:Normal     Neuro/Psych negative neurological ROS  negative psych ROS   GI/Hepatic negative GI ROS, Neg liver ROS,   Endo/Other  negative endocrine ROS  Renal/GU negative Renal ROS  negative genitourinary   Musculoskeletal negative musculoskeletal ROS (+)   Abdominal   Peds  Hematology negative hematology ROS (+)   Anesthesia Other Findings   Reproductive/Obstetrics negative OB ROS                            Anesthesia Physical Anesthesia Plan  ASA: 2  Anesthesia Plan: MAC   Post-op Pain Management:    Induction: Intravenous  PONV Risk Score and Plan:   Airway Management Planned: Natural Airway and Nasal Cannula  Additional Equipment:   Intra-op Plan:   Post-operative Plan:   Informed Consent: I have reviewed the patients History and Physical, chart, labs and discussed the procedure including the risks, benefits and alternatives for the proposed anesthesia with the patient or authorized representative who has indicated his/her understanding and acceptance.     Dental Advisory Given  Plan Discussed with: Anesthesiologist, CRNA and Surgeon  Anesthesia Plan Comments: (Patient consented for risks of anesthesia including but not limited to:  - adverse reactions to medications - damage to eyes,  teeth, lips or other oral mucosa - nerve damage due to positioning  - sore throat or hoarseness - Damage to heart, brain, nerves, lungs, other parts of body or loss of life  Patient voiced understanding.)        Anesthesia Quick Evaluation

## 2021-11-08 NOTE — Transfer of Care (Signed)
Immediate Anesthesia Transfer of Care Note  Patient: Susan Green  Procedure(s) Performed: COLONOSCOPY WITH PROPOFOL  Patient Location: PACU and Endoscopy Unit  Anesthesia Type:General  Level of Consciousness: drowsy and patient cooperative  Airway & Oxygen Therapy: Patient Spontanous Breathing  Post-op Assessment: Report given to RN and Post -op Vital signs reviewed and stable  Post vital signs: Reviewed and stable  Last Vitals:  Vitals Value Taken Time  BP 121/68 11/08/21 1133  Temp 35.8 C 11/08/21 1127  Pulse 62 11/08/21 1133  Resp 17 11/08/21 1133  SpO2 99 % 11/08/21 1133  Vitals shown include unvalidated device data.  Last Pain:  Vitals:   11/08/21 1127  TempSrc: Temporal  PainSc: Asleep         Complications: No notable events documented.

## 2021-11-09 ENCOUNTER — Encounter: Payer: Self-pay | Admitting: General Surgery

## 2021-12-19 ENCOUNTER — Ambulatory Visit
Admission: RE | Admit: 2021-12-19 | Discharge: 2021-12-19 | Disposition: A | Discharge status: Home / Self Care | Payer: 59 | Source: Ambulatory Visit | Attending: Internal Medicine | Admitting: Internal Medicine

## 2021-12-19 DIAGNOSIS — C50811 Malignant neoplasm of overlapping sites of right female breast: Secondary | ICD-10-CM | POA: Insufficient documentation

## 2021-12-19 DIAGNOSIS — Z170 Estrogen receptor positive status [ER+]: Secondary | ICD-10-CM | POA: Insufficient documentation

## 2022-10-26 ENCOUNTER — Encounter: Payer: Self-pay | Admitting: Internal Medicine

## 2022-10-26 ENCOUNTER — Ambulatory Visit (INDEPENDENT_AMBULATORY_CARE_PROVIDER_SITE_OTHER): Payer: 59

## 2022-10-26 ENCOUNTER — Ambulatory Visit
Admission: EM | Admit: 2022-10-26 | Discharge: 2022-10-26 | Disposition: A | Discharge status: Home / Self Care | Payer: 59 | Attending: Emergency Medicine | Admitting: Emergency Medicine

## 2022-10-26 ENCOUNTER — Encounter: Payer: Self-pay | Admitting: Emergency Medicine

## 2022-10-26 DIAGNOSIS — M79674 Pain in right toe(s): Secondary | ICD-10-CM

## 2022-10-26 DIAGNOSIS — S90121A Contusion of right lesser toe(s) without damage to nail, initial encounter: Secondary | ICD-10-CM | POA: Diagnosis not present

## 2022-10-26 NOTE — Discharge Instructions (Signed)
Your x-rays did not demonstrate any broken bones in your left third toe.  I do believe that you have just bruised your toe with the ceramic candle.  Wear shoes with a wide toebox to allow more room to accommodate the swelling from your toe and to provide greater comfort.  Use over-the-counter Tylenol and/or ibuprofen according to package instructions as needed for pain and inflammation.  Elevation and ice application can also be helpful for both inflammation and pain relief.  If you do not have any improvement of your symptoms, or they worsen, either return for reevaluation or follow-up with orthopedics.

## 2022-10-26 NOTE — ED Triage Notes (Addendum)
Patient states that she dropped a ceramic candle on her right foot yesterday.  Patient c/o bruising, swelling and pain in her right 3rd toe.  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  Patient might need a work note.

## 2022-10-26 NOTE — ED Provider Notes (Addendum)
[facility]    CSN: [id number] Arrival date & time: 10/26/22  1013      History   Chief Complaint Chief Complaint  Patient presents with   Toe Pain    Right 3rd toe    HPI Susan Green is a 62 y.o. female.   HPI  62 year old female with a past medical history significant for breast cancer, thyroid disease, and psoriasis presents for evaluation of pain, bruising, and swelling to her right third toe.  She reports that she was mopping at her sister's house and actually knocked over a ceramic candle which landed on her foot.  She denies any numbness or tingling in her toes.  Past Medical History:  Diagnosis Date   174.9 2011   Right breast T1c,N1 (mic),ER 90%; PR 90%, Her 2 neu not over expressed treated with chemotherapy and reconstruction   Breast cancer ([facility]) 01/2009   Right with Chemo   Cancer [facility])    Personal history of chemotherapy 2010   BREAST CA   PONV (postoperative nausea and vomiting)    very naseated   Psoriasis    Thyroid disease    hypothyroidism    Patient Active Problem List   Diagnosis Date Noted   Osteopenia of neck of right femur 02/11/2019   SBO (small bowel obstruction) ([facility]) 10/04/2018   Carcinoma of overlapping 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, estrogen receptor positive ([facility]) 08/24/2016   Mass of arm, left 05/09/2016    Past Surgical History:  Procedure Laterality Date   APPENDECTOMY  04/29/13   APPENDECTOMY     AUGMENTATION MAMMAPLASTY Bilateral 08/2009   Reconstruction   BREAST BIOPSY Right 2010   Positive   BREAST RECONSTRUCTION  2011, 2012   3   BREAST RECONSTRUCTION Right 2011   BREAST SURGERY Right 01/25/2009   mastectomy   BREAST SURGERY Left 2011   made to match the right breast   BREAST SURGERY     cecopexy  04-29-13   COLONOSCOPY WITH PROPOFOL N/A 11/08/2021   Procedure: COLONOSCOPY WITH PROPOFOL;  Surgeon: Earline Mayotte, MD;  Location: [facility] ENDOSCOPY;  Service: Endoscopy;  Laterality: N/A;   CRANIOTOMY FOR  AVM  1998   DILATION AND CURETTAGE OF UTERUS  2008   polyp   KNEE ARTHROSCOPY Right 2005   KNEE ARTHROSCOPY Left 09/22/2014   Procedure: Left knee arthroscopy, partial medial menisectomy;  Surgeon: Donato Heinz, MD;  Location: [facility] ORS;  Service: Orthopedics;  Laterality: Left;   KNEE ARTHROSCOPY Bilateral    MASTECTOMY Right 2010   BREAST CA   PORTACATH PLACEMENT  2011   TONSILLECTOMY AND ADENOIDECTOMY  1966   TOTAL LAPAROSCOPIC HYSTERECTOMY WITH BILATERAL SALPINGO OOPHORECTOMY      OB History   No obstetric history on file.    Obstetric Comments  1st Menstrual Cycle:  12 1st Pregnancy:  0          Home Medications    Prior to Admission medications   Medication Sig Start Date End Date Taking? Authorizing Provider  Apremilast (OTEZLA) 30 MG TABS Take 1 tablet by mouth daily.     [provider]  b complex vitamins tablet Take 2 tablets by mouth daily.    [provider]  Biotin 5000 MCG TABS Take 1 tablet by mouth.    [provider]  CALCIUM-MAGNESIUM PO Take 1 tablet by mouth.    [provider]  Calcium-Vitamin D 600-200 MG-UNIT per tablet Take 1 tablet by mouth daily.  [provider]  conjugated estrogens (PREMARIN) vaginal cream Place 1 Applicatorful vaginally 2 (two) times a week. 1 gram vaginally at bedtime twice weekly 12/08/18   Conard Novak, MD  Flaxseed, Linseed, (FLAX SEED OIL) 1300 MG CAPS Take 2 capsules by mouth daily with lunch.    [provider]  levothyroxine (SYNTHROID) 100 MCG tablet Take 1 tablet by mouth 2 (two) times a week.    [provider]  levothyroxine (SYNTHROID) 88 MCG tablet Take 1 tablet by mouth 3 (three) times a week.    [provider]  liothyronine (CYTOMEL) 5 MCG tablet 1/2 tab MWF. Take on an empty stomach with a glass of water at least 30-60 minutes before breakfast. 06/09/19   [provider]  rosuvastatin (CRESTOR) 5 MG tablet Take 0.5 tablets by  mouth daily. 05/23/18   [provider]  vitamin E 400 UNIT capsule Take 400 Units by mouth daily.    [provider]    Family History Family History  Problem Relation Age of Onset   Healthy Mother    Healthy Father    Diabetes Father    Heart disease Father    Colon cancer Paternal Grandfather 58   Heart disease Paternal Grandfather    Breast cancer Maternal Aunt 8   Cancer - Other Sister 48       Vul[place]   Hypertension Maternal Grandmother     Social History Social History   Tobacco Use   Smoking status: Never   Smokeless tobacco: Never  Vaping Use   Vaping status: Never Used  Substance Use Topics   Alcohol use: Never   Drug use: Never     Allergies   Dilaudid [hydromorphone hcl], Alendronate, Codeine, and Demerol [meperidine]   Review of Systems Review of Systems  Constitutional:  Negative for fever.  Musculoskeletal:  Positive for arthralgias and joint swelling.  Skin:  Positive for color change.  Neurological:  Negative for weakness and numbness.     Physical Exam Triage Vital Signs ED Triage Vitals  Encounter Vitals Group     BP      Systolic BP Percentile      Diastolic BP Percentile      Pulse      Resp      Temp      Temp src      SpO2      Weight      Height      Head Circumference      Peak Flow      Pain Score      Pain Loc      Pain Education      Exclude from Growth Chart    No data found.  Updated Vital Signs BP (!) 146/88 (BP Location: Right Arm)   Pulse (!) 51   Temp 98.3 F (36.8 C) (Oral)   Resp 14   Ht 5\' 3"  (1.6 m)   Wt 124 lb (56.2 kg)   SpO2 96%   BMI 21.97 kg/m   Visual Acuity Right Eye Distance:   Left Eye Distance:   Bilateral Distance:    Right Eye Near:   Left Eye Near:    Bilateral Near:     Physical Exam Vitals and nursing note reviewed.  Constitutional:      Appearance: Normal appearance. She is not ill-appearing.  HENT:     Head: Normocephalic and atraumatic.   Musculoskeletal:        General: Swelling, tenderness  and signs of injury present. No deformity.  Skin:    General: Skin is warm and dry.     Capillary Refill: Capillary refill takes less than 2 seconds.     Findings: Bruising present.  Neurological:     General: No focal deficit present.     Mental Status: She is alert and oriented to person, place, and time.      UC Treatments / Results  Labs (all labs ordered are listed, but only abnormal results are displayed) Labs Reviewed - No data to display  EKG   Radiology No results found.  Procedures Procedures (including critical care time)  Medications Ordered in UC Medications - No data to display  Initial Impression / Assessment and Plan / UC Course  I have reviewed the triage vital signs and the nursing notes.  Pertinent labs & imaging results that were available during my care of the patient were reviewed by me and considered in my medical decision making (see chart for details).   Patient is a pleasant, nontoxic-appearing 62 year old female presenting for evaluation of ecchymosis, swelling, and pain to her right third toe.    As you can see in images above, the individual digit is quite edematous and ecchymotic.  The visible portion of the toenail does not demonstrate any evidence of subungual hematoma.  She is able to wiggle her toe to some degree but it is limited by pain and swelling.  Normal sensation at the tip of the toe.  I will obtain radiograph of her right third toe to rule out any bony abnormality.  Right foot x-ray independently reviewed and evaluated by me.  Impression: No evidence of fracture or dislocation.  Soft tissues do demonstrate mild swelling.  Radiology overread is pending. Radiology impression agrees with my above interpretation.  I will discharge patient home with a diagnosis of contusion to her right third toe.  She can use over-the-counter Tylenol and/or ibuprofen to help with pain and  swelling along with elevation and ice application.  She should wear shoes with a wide toe box for comfort.  Return precautions reviewed.  Work note provided.   Final Clinical Impressions(s) / UC Diagnoses   Final diagnoses:  Contusion of third toe of right foot, initial encounter     Discharge Instructions      Your x-rays did not demonstrate any broken bones in your left third toe.  I do believe that you have just bruised your toe with the ceramic candle.  Wear shoes with a wide toebox to allow more room to accommodate the swelling from your toe and to provide greater comfort.  Use over-the-counter Tylenol and/or ibuprofen according to package instructions as needed for pain and inflammation.  Elevation and ice application can also be helpful for both inflammation and pain relief.  If you do not have any improvement of your symptoms, or they worsen, either return for reevaluation or follow-up with orthopedics.     ED Prescriptions   None    PDMP not reviewed this encounter.   Becky Augusta, NP 10/26/22 1205    Becky Augusta, NP 10/26/22 [phone]

## 2022-10-30 ENCOUNTER — Inpatient Hospital Stay: Payer: 59 | Admitting: Internal Medicine

## 2022-10-30 ENCOUNTER — Inpatient Hospital Stay: Payer: 59

## 2022-11-12 ENCOUNTER — Inpatient Hospital Stay: Payer: 59 | Admitting: Internal Medicine

## 2022-11-12 ENCOUNTER — Inpatient Hospital Stay: Payer: 59

## 2022-12-07 ENCOUNTER — Ambulatory Visit: Payer: 59 | Admitting: Nurse Practitioner

## 2022-12-07 ENCOUNTER — Other Ambulatory Visit: Payer: 59

## 2023-01-01 ENCOUNTER — Other Ambulatory Visit: Payer: Self-pay | Admitting: *Deleted

## 2023-01-01 DIAGNOSIS — C50811 Malignant neoplasm of overlapping sites of right female breast: Secondary | ICD-10-CM

## 2023-01-03 ENCOUNTER — Inpatient Hospital Stay: Payer: 59 | Attending: Internal Medicine

## 2023-01-03 ENCOUNTER — Inpatient Hospital Stay (HOSPITAL_BASED_OUTPATIENT_CLINIC_OR_DEPARTMENT_OTHER): Payer: 59 | Admitting: Internal Medicine

## 2023-01-03 ENCOUNTER — Encounter: Payer: Self-pay | Admitting: Internal Medicine

## 2023-01-03 DIAGNOSIS — C50811 Malignant neoplasm of overlapping sites of right female breast: Secondary | ICD-10-CM | POA: Diagnosis not present

## 2023-01-03 DIAGNOSIS — Z853 Personal history of malignant neoplasm of breast: Secondary | ICD-10-CM | POA: Diagnosis present

## 2023-01-03 DIAGNOSIS — Z803 Family history of malignant neoplasm of breast: Secondary | ICD-10-CM | POA: Diagnosis not present

## 2023-01-03 DIAGNOSIS — Z9221 Personal history of antineoplastic chemotherapy: Secondary | ICD-10-CM | POA: Insufficient documentation

## 2023-01-03 DIAGNOSIS — Z800 Family history of malignant neoplasm of digestive organs: Secondary | ICD-10-CM | POA: Insufficient documentation

## 2023-01-03 DIAGNOSIS — Z808 Family history of malignant neoplasm of other organs or systems: Secondary | ICD-10-CM | POA: Diagnosis not present

## 2023-01-03 DIAGNOSIS — Z9011 Acquired absence of right breast and nipple: Secondary | ICD-10-CM | POA: Insufficient documentation

## 2023-01-03 DIAGNOSIS — M8580 Other specified disorders of bone density and structure, unspecified site: Secondary | ICD-10-CM | POA: Insufficient documentation

## 2023-01-03 DIAGNOSIS — Z170 Estrogen receptor positive status [ER+]: Secondary | ICD-10-CM | POA: Diagnosis not present

## 2023-01-03 DIAGNOSIS — D751 Secondary polycythemia: Secondary | ICD-10-CM | POA: Diagnosis not present

## 2023-01-03 LAB — CBC WITH DIFFERENTIAL (CANCER CENTER ONLY)
Abs Immature Granulocytes: 0 10*3/uL (ref 0.00–0.07)
Basophils Absolute: 0 10*3/uL (ref 0.0–0.1)
Basophils Relative: 1 %
Eosinophils Absolute: 0.2 10*3/uL (ref 0.0–0.5)
Eosinophils Relative: 5 %
HCT: 48 % — ABNORMAL HIGH (ref 36.0–46.0)
Hemoglobin: 15.9 g/dL — ABNORMAL HIGH (ref 12.0–15.0)
Immature Granulocytes: 0 %
Lymphocytes Relative: 25 %
Lymphs Abs: 1.2 10*3/uL (ref 0.7–4.0)
MCH: 30.9 pg (ref 26.0–34.0)
MCHC: 33.1 g/dL (ref 30.0–36.0)
MCV: 93.2 fL (ref 80.0–100.0)
Monocytes Absolute: 0.4 10*3/uL (ref 0.1–1.0)
Monocytes Relative: 9 %
Neutro Abs: 3 10*3/uL (ref 1.7–7.7)
Neutrophils Relative %: 60 %
Platelet Count: 173 10*3/uL (ref 150–400)
RBC: 5.15 MIL/uL — ABNORMAL HIGH (ref 3.87–5.11)
RDW: 12.7 % (ref 11.5–15.5)
WBC Count: 4.9 10*3/uL (ref 4.0–10.5)
nRBC: 0 % (ref 0.0–0.2)

## 2023-01-03 LAB — CMP (CANCER CENTER ONLY)
ALT: 19 U/L (ref 0–44)
AST: 29 U/L (ref 15–41)
Albumin: 4.5 g/dL (ref 3.5–5.0)
Alkaline Phosphatase: 46 U/L (ref 38–126)
Anion gap: 10 (ref 5–15)
BUN: 27 mg/dL — ABNORMAL HIGH (ref 8–23)
CO2: 28 mmol/L (ref 22–32)
Calcium: 9.7 mg/dL (ref 8.9–10.3)
Chloride: 99 mmol/L (ref 98–111)
Creatinine: 1.05 mg/dL — ABNORMAL HIGH (ref 0.44–1.00)
GFR, Estimated: >60 mL/min (ref >60)
Glucose, Bld: 87 mg/dL (ref 70–99)
Potassium: 3.7 mmol/L (ref 3.5–5.1)
Sodium: 137 mmol/L (ref 135–145)
Total Bilirubin: 0.8 mg/dL (ref <1.2)
Total Protein: 7.8 g/dL (ref 6.5–8.1)

## 2023-01-03 NOTE — Progress Notes (Signed)
[facility] OFFICE PROGRESS NOTE  Patient Care Team: Kandyce Rud, MD as PCP - General (Family Medicine) Lemar Livings, Merrily Pew, MD as Consulting Physician (General Surgery) Kandyce Rud, MD (Family Medicine) Earna Coder, MD as Consulting Physician (Hematology and Oncology)   Cancer Staging  No matching staging information was found for the patient.    Oncology History Overview Note  carcinoma of breast, diagnosis.  In February of 2011.  Right breast.  ER positive.  PR positive, HER-2/neu, and not overexpressed s/p Right mastec  Status post 4 cycles of chemotherapy with Cytoxan, and Taxotere Finished chemotherapy on 07/07/2009  Status post bilateral   oopherectomy  and  hysterectomy in February of 2012   Tamoxifen June of 2011   # Genetcis- delcines  # Osteopenia- hair loss sec to Foaomax   Carcinoma of overlapping 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, estrogen receptor positive (HCC)  08/24/2016 Initial Diagnosis   Carcinoma of overlapping 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, estrogen receptor positive (HCC)      INTERVAL HISTORY: Alone.  Ambulating independently.  UDY PORE 62 y.o.  female pleasant patient above history of 2011-right breast cancer  ER/PR positive; Her2 negative is here for follow-up/ and review the results of the bone density.   Patient had a mole frozen last week. Colonoscopy since last visit. Had an echo and EKG in June 2024 at [facility]    However she denies any new lumps or bumps.  Appetite is good.  No weight loss.  She continues to be physically active.  Review of Systems  Constitutional:  Negative for chills, diaphoresis, fever, malaise/fatigue and weight loss.  HENT:  Negative for nosebleeds and sore throat.   Eyes:  Negative for double vision.  Respiratory:  Negative for cough, hemoptysis, sputum production, shortness of breath and wheezing.   Cardiovascular:  Negative for chest pain, palpitations, orthopnea and leg swelling.   Gastrointestinal:  Negative for abdominal pain, blood in stool, constipation, diarrhea, heartburn, melena, nausea and vomiting.  Genitourinary:  Negative for dysuria, frequency and urgency.  Musculoskeletal:  Negative for back pain and joint pain.  Skin: Negative.  Negative for itching and rash.  Neurological:  Negative for dizziness, tingling, focal weakness, weakness and headaches.  Endo/Heme/Allergies:  Does not bruise/bleed easily.  Psychiatric/Behavioral:  Negative for depression. The patient is not nervous/anxious and does not have insomnia.       PAST MEDICAL HISTORY :  Past Medical History:  Diagnosis Date   174.9 2011   Right breast T1c,N1 (mic),ER 90%; PR 90%, Her 2 neu not over expressed treated with chemotherapy and reconstruction   Breast cancer (HCC) 01/2009   Right with Chemo   Cancer (HCC)    Personal history of chemotherapy 2010   BREAST CA   PONV (postoperative nausea and vomiting)    very naseated   Psoriasis    Thyroid disease    hypothyroidism    PAST SURGICAL HISTORY :   Past Surgical History:  Procedure Laterality Date   APPENDECTOMY  04/29/13   APPENDECTOMY     AUGMENTATION MAMMAPLASTY Bilateral 08/2009   Reconstruction   BREAST BIOPSY Right 2010   Positive   BREAST RECONSTRUCTION  2011, 2012   3   BREAST RECONSTRUCTION Right 2011   BREAST SURGERY Right 01/25/2009   mastectomy   BREAST SURGERY Left 2011   made to match the right breast   BREAST SURGERY     cecopexy  04-29-13   COLONOSCOPY WITH PROPOFOL N/A  11/08/2021   Procedure: COLONOSCOPY WITH PROPOFOL;  Surgeon: Earline Mayotte, MD;  Location: [facility] ENDOSCOPY;  Service: Endoscopy;  Laterality: N/A;   CRANIOTOMY FOR AVM  1998   DILATION AND CURETTAGE OF UTERUS  2008   polyp   KNEE ARTHROSCOPY Right 2005   KNEE ARTHROSCOPY Left 09/22/2014   Procedure: Left knee arthroscopy, partial medial menisectomy;  Surgeon: Donato Heinz, MD;  Location: [facility] ORS;  Service: Orthopedics;  Laterality: Left;    KNEE ARTHROSCOPY Bilateral    MASTECTOMY Right 2010   BREAST CA   PORTACATH PLACEMENT  2011   TONSILLECTOMY AND ADENOIDECTOMY  1966   TOTAL LAPAROSCOPIC HYSTERECTOMY WITH BILATERAL SALPINGO OOPHORECTOMY      FAMILY HISTORY :   Family History  Problem Relation Age of Onset   Healthy Mother    Healthy Father    Diabetes Father    Heart disease Father    Colon cancer Paternal Grandfather 10   Heart disease Paternal Grandfather    Breast cancer Maternal Aunt 49   Cancer - Other Sister 80       Vulvar   Hypertension Maternal Grandmother     SOCIAL HISTORY:   Social History   Tobacco Use   Smoking status: Never   Smokeless tobacco: Never  Vaping Use   Vaping status: Never Used  Substance Use Topics   Alcohol use: Never   Drug use: Never    ALLERGIES:  is allergic to dilaudid [hydromorphone hcl], alendronate, codeine, and demerol [meperidine].  MEDICATIONS:  Current Outpatient Medications  Medication Sig Dispense Refill   Apremilast (OTEZLA) 30 MG TABS Take 1 tablet by mouth daily.      b complex vitamins tablet Take 2 tablets by mouth daily.     Biotin 5000 MCG TABS Take 1 tablet by mouth.     CALCIUM-MAGNESIUM PO Take 1 tablet by mouth.     Calcium-Vitamin D 600-200 MG-UNIT per tablet Take 1 tablet by mouth daily.      conjugated estrogens (PREMARIN) vaginal cream Place 1 Applicatorful vaginally 2 (two) times a week. 1 gram vaginally at bedtime twice weekly 30 g 3   Flaxseed, Linseed, (FLAX SEED OIL) 1300 MG CAPS Take 2 capsules by mouth daily with lunch.     levothyroxine (SYNTHROID) 88 MCG tablet Take 1 tablet by mouth daily before breakfast.     liothyronine (CYTOMEL) 5 MCG tablet Take 5 mcg by mouth. Tuesday and Friday     rosuvastatin (CRESTOR) 5 MG tablet Take 0.5 tablets by mouth daily.     vitamin E 400 UNIT capsule Take 400 Units by mouth daily.     No current facility-administered medications for this visit.    PHYSICAL EXAMINATION: ECOG PERFORMANCE  STATUS: 0 - Asymptomatic  Right and left BREAST exam [in the presence of nurse]-right mastectomy with implant noted.  Left breast -implant noted; no unusual skin changes or dominant masses felt. Surgical scars noted.    BP 133/88 (BP Location: Left Arm, Patient Position: Sitting, Cuff Size: Normal)   Pulse (!) 57   Temp (!) 96.2 F (35.7 C) (Tympanic)   Ht 5\' 3"  (1.6 m)   Wt 126 lb 9.6 oz (57.4 kg)   SpO2 98%   BMI 22.43 kg/m   Filed Weights   01/03/23 1025  Weight: 126 lb 9.6 oz (57.4 kg)    Physical Exam HENT:     Head: Normocephalic and atraumatic.     Mouth/Throat:     Pharynx: No oropharyngeal exudate.  Eyes:     Pupils: Pupils are equal, round, and reactive to light.  Cardiovascular:     Rate and Rhythm: Normal rate and regular rhythm.  Pulmonary:     Effort: No respiratory distress.     Breath sounds: No wheezing.  Abdominal:     General: Bowel sounds are normal. There is no distension.     Palpations: Abdomen is soft. There is no mass.     Tenderness: There is no abdominal tenderness. There is no guarding or rebound.  Musculoskeletal:        General: No tenderness. Normal range of motion.     Cervical back: Normal range of motion and neck supple.  Skin:    General: Skin is warm.     Comments: Breast exam  [in presence of nurse] right breast mastectomy ; implant-no lumps or bumps.  Left breast no unusual skin changes or dominant masses felt-implant in place   Neurological:     Mental Status: She is alert and oriented to person, place, and time.  Psychiatric:        Mood and Affect: Affect normal.    LABORATORY DATA:  I have reviewed the data as listed    Component Value Date/Time   NA 137 01/03/2023 1007   NA 140 01/27/2014 1538   K 3.7 01/03/2023 1007   K 3.7 01/27/2014 1538   CL 99 01/03/2023 1007   CL 103 01/27/2014 1538   CO2 28 01/03/2023 1007   CO2 34 (H) 01/27/2014 1538   GLUCOSE 87 01/03/2023 1007   GLUCOSE 140 (H) 01/27/2014 1538   BUN 27  (H) 01/03/2023 1007   BUN 18 01/27/2014 1538   CREATININE 1.05 (H) 01/03/2023 1007   CREATININE 1.02 01/27/2014 1538   CALCIUM 9.7 01/03/2023 1007   CALCIUM 8.4 (L) 01/27/2014 1538   PROT 7.8 01/03/2023 1007   PROT 6.7 01/27/2014 1538   ALBUMIN 4.5 01/03/2023 1007   ALBUMIN 3.4 01/27/2014 1538   AST 29 01/03/2023 1007   ALT 19 01/03/2023 1007   ALT 22 01/27/2014 1538   ALKPHOS 46 01/03/2023 1007   ALKPHOS 49 01/27/2014 1538   BILITOT 0.8 01/03/2023 1007   GFRNONAA >60 01/03/2023 1007   GFRNONAA >60 01/27/2014 1538   GFRNONAA >60 05/22/2013 1212   GFRAA >60 09/22/2019 0921   GFRAA >60 01/27/2014 1538   GFRAA >60 05/22/2013 1212    No results found for: "SPEP", "UPEP"  Lab Results  Component Value Date   WBC 4.9 01/03/2023   NEUTROABS 3.0 01/03/2023   HGB 15.9 (H) 01/03/2023   HCT 48.0 (H) 01/03/2023   MCV 93.2 01/03/2023   PLT 173 01/03/2023      Chemistry      Component Value Date/Time   NA 137 01/03/2023 1007   NA 140 01/27/2014 1538   K 3.7 01/03/2023 1007   K 3.7 01/27/2014 1538   CL 99 01/03/2023 1007   CL 103 01/27/2014 1538   CO2 28 01/03/2023 1007   CO2 34 (H) 01/27/2014 1538   BUN 27 (H) 01/03/2023 1007   BUN 18 01/27/2014 1538   CREATININE 1.05 (H) 01/03/2023 1007   CREATININE 1.02 01/27/2014 1538      Component Value Date/Time   CALCIUM 9.7 01/03/2023 1007   CALCIUM 8.4 (L) 01/27/2014 1538   ALKPHOS 46 01/03/2023 1007   ALKPHOS 49 01/27/2014 1538   AST 29 01/03/2023 1007   ALT 19 01/03/2023 1007   ALT 22 01/27/2014 1538  BILITOT 0.8 01/03/2023 1007       RADIOGRAPHIC STUDIES: I have personally reviewed the radiological images as listed and agreed with the findings in the report. No results found.   ASSESSMENT & PLAN:  Carcinoma of overlapping 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, estrogen receptor positive (HCC) # STAGE I -right breast ER/PR positive, HER-2 not overexpressed. finished tamoxifen sep 2017; under active surveillance.  Stable.   Clinically no evidence of recurrence.  Status post right mastectomy; Left mastopexy/implant-recommend screening mammogram of the left side. OCT 2023- LEFT UNILATERAL- mammogram- WNL.   # OCT 2023- Dexa Scan: T-score of -2.9. Discussed the potential risk factors for osteoporosis- age/gender/postmenopausal status/use of anti-estrogen treatments. Patient currently exercising;/ on calcium and vitamin D supplementation/ and also use of bisphosphonates.Fosomax [hair loss] versus parenteral bisphosphonate like Reclast/ RANK ligand inhibitors- desnosumab. Discussed the potential benefits and/side effects  Including but not limited to Osteonecrosis of jaw/ hypocalcemia. Patient will get dental clearance. Pt planning reclast in Jan 2025.   #  Secondary Erythrocytosis: JAK-2 NEGATIVE; suspect secondary.  Monitor for now.  # Hair loss-s/p evaluation with dermatology/awaiting plasma replacement therapy- s/p dermatology/plastic surgery.  # DISPOSITION: #  print out labs # Left screening mammogram # follow up in 12 months; MD-labs- cbc/cmp ca-27-29; vit D 25- [place] levels;.-Dr.B   Orders Placed This Encounter  Procedures   MM 3D SCREENING MAMMOGRAM UNILATERAL LEFT BREAST    Standing Status:   Future    Standing Expiration Date:   03/05/2023    Order Specific Question:   Reason for Exam (SYMPTOM  OR DIAGNOSIS REQUIRED)    Answer:   Hx Breast Cancer    Order Specific Question:   Preferred imaging location?    Answer:   [facility]   CBC with Differential (Cancer Center Only)    Standing Status:   Future    Standing Expiration Date:   01/03/2024   CMP (Cancer Center only)    Standing Status:   Future    Standing Expiration Date:   01/03/2024   VITAMIN D 25 Hydroxy (Vit-D Deficiency, Fractures)    Standing Status:   Future    Standing Expiration Date:   01/03/2024   Cancer antigen 27.29    Standing Status:   Future    Standing Expiration Date:   01/03/2024   All questions were answered. The  patient knows to call the clinic with any problems, questions or concerns.      Earna Coder, MD 01/03/2023 11:41 AM

## 2023-01-03 NOTE — Progress Notes (Signed)
Had a mole frozen last week. Colonoscopy since last visit. Had an echo and EKG in June 2024 at [facility].

## 2023-01-03 NOTE — Assessment & Plan Note (Addendum)
#   STAGE I -right breast ER/PR positive, HER-2 not overexpressed. finished tamoxifen sep 2017; under active surveillance.  Stable.  Clinically no evidence of recurrence.  Status post right mastectomy; Left mastopexy/implant-recommend screening mammogram of the left side. OCT 2023- LEFT UNILATERAL- mammogram- WNL.   # OCT 2023- Dexa Scan: T-score of -2.9. Discussed the potential risk factors for osteoporosis- age/gender/postmenopausal status/use of anti-estrogen treatments. Patient currently exercising;/ on calcium and vitamin D supplementation/ and also use of bisphosphonates.Fosomax [hair loss] versus parenteral bisphosphonate like Reclast/ RANK ligand inhibitors- desnosumab. Discussed the potential benefits and/side effects  Including but not limited to Osteonecrosis of jaw/ hypocalcemia. Patient will get dental clearance. Pt planning reclast in Jan 2025.   #  Secondary Erythrocytosis: JAK-2 NEGATIVE; suspect secondary.  Monitor for now.  # Hair loss-s/p evaluation with dermatology/awaiting plasma replacement therapy- s/p dermatology/plastic surgery.  # DISPOSITION: #  print out labs # Left screening mammogram # follow up in 12 months; MD-labs- cbc/cmp ca-27-29; vit D 25- [place] levels;.-Dr.B

## 2023-01-04 LAB — CANCER ANTIGEN 27.29: CA 27.29: 25.5 U/mL (ref 0.0–38.6)

## 2023-01-04 LAB — VITAMIN D 25 HYDROXY (VIT D DEFICIENCY, FRACTURES): Vit D, 25-Hydroxy: 81.92 ng/mL (ref 30–100)

## 2023-01-08 ENCOUNTER — Other Ambulatory Visit: Payer: 59

## 2023-01-08 ENCOUNTER — Ambulatory Visit: Payer: 59 | Admitting: Internal Medicine

## 2023-01-09 ENCOUNTER — Ambulatory Visit
Admission: RE | Admit: 2023-01-09 | Discharge: 2023-01-09 | Disposition: A | Discharge status: Home / Self Care | Payer: 59 | Source: Ambulatory Visit | Attending: Internal Medicine | Admitting: Internal Medicine

## 2023-01-09 ENCOUNTER — Other Ambulatory Visit: Payer: Self-pay | Admitting: Internal Medicine

## 2023-01-09 DIAGNOSIS — N6320 Unspecified lump in the left breast, unspecified quadrant: Secondary | ICD-10-CM | POA: Diagnosis not present

## 2023-01-09 DIAGNOSIS — Z170 Estrogen receptor positive status [ER+]: Secondary | ICD-10-CM | POA: Insufficient documentation

## 2023-01-09 DIAGNOSIS — C50811 Malignant neoplasm of overlapping sites of right female breast: Secondary | ICD-10-CM | POA: Diagnosis present

## 2023-01-09 DIAGNOSIS — Z1231 Encounter for screening mammogram for malignant neoplasm of breast: Secondary | ICD-10-CM | POA: Diagnosis not present

## 2023-01-11 ENCOUNTER — Other Ambulatory Visit: Payer: Self-pay | Admitting: Internal Medicine

## 2023-01-11 DIAGNOSIS — R928 Other abnormal and inconclusive findings on diagnostic imaging of breast: Secondary | ICD-10-CM

## 2023-01-15 ENCOUNTER — Ambulatory Visit
Admission: RE | Admit: 2023-01-15 | Discharge: 2023-01-15 | Disposition: A | Discharge status: Home / Self Care | Payer: 59 | Source: Ambulatory Visit | Attending: Internal Medicine | Admitting: Internal Medicine

## 2023-01-15 DIAGNOSIS — R928 Other abnormal and inconclusive findings on diagnostic imaging of breast: Secondary | ICD-10-CM

## 2023-05-30 ENCOUNTER — Ambulatory Visit (INDEPENDENT_AMBULATORY_CARE_PROVIDER_SITE_OTHER)

## 2023-05-30 ENCOUNTER — Ambulatory Visit
Admission: EM | Admit: 2023-05-30 | Discharge: 2023-05-30 | Disposition: A | Discharge status: Home / Self Care | Attending: Family Medicine | Admitting: Family Medicine

## 2023-05-30 ENCOUNTER — Encounter: Payer: Self-pay | Admitting: Emergency Medicine

## 2023-05-30 DIAGNOSIS — M25551 Pain in right hip: Secondary | ICD-10-CM | POA: Diagnosis not present

## 2023-05-30 DIAGNOSIS — R109 Unspecified abdominal pain: Secondary | ICD-10-CM | POA: Diagnosis not present

## 2023-05-30 LAB — COMPREHENSIVE METABOLIC PANEL WITH GFR
ALT: 19 U/L (ref 0–44)
AST: 40 U/L (ref 15–41)
Albumin: 4.2 g/dL (ref 3.5–5.0)
Alkaline Phosphatase: 45 U/L (ref 38–126)
Anion gap: 10 (ref 5–15)
BUN: 40 mg/dL — ABNORMAL HIGH (ref 8–23)
CO2: 27 mmol/L (ref 22–32)
Calcium: 9.6 mg/dL (ref 8.9–10.3)
Chloride: 100 mmol/L (ref 98–111)
Creatinine, Ser: 0.91 mg/dL (ref 0.44–1.00)
GFR, Estimated: >60 mL/min (ref >60)
Glucose, Bld: 100 mg/dL — ABNORMAL HIGH (ref 70–99)
Potassium: 3.7 mmol/L (ref 3.5–5.1)
Sodium: 137 mmol/L (ref 135–145)
Total Bilirubin: 0.3 mg/dL (ref 0.0–1.2)
Total Protein: 7.3 g/dL (ref 6.5–8.1)

## 2023-05-30 LAB — URINALYSIS, W/ REFLEX TO CULTURE (INFECTION SUSPECTED)
Bilirubin Urine: NEGATIVE
Glucose, UA: NEGATIVE mg/dL
Hgb urine dipstick: NEGATIVE
Ketones, ur: NEGATIVE mg/dL
Leukocytes,Ua: NEGATIVE
Nitrite: NEGATIVE
Protein, ur: NEGATIVE mg/dL
Specific Gravity, Urine: <1.005 — ABNORMAL LOW (ref 1.005–1.030)
pH: 5 (ref 5.0–8.0)

## 2023-05-30 LAB — CBC WITH DIFFERENTIAL/PLATELET
Abs Immature Granulocytes: 0.01 10*3/uL (ref 0.00–0.07)
Basophils Absolute: 0 10*3/uL (ref 0.0–0.1)
Basophils Relative: 1 %
Eosinophils Absolute: 0.2 10*3/uL (ref 0.0–0.5)
Eosinophils Relative: 3 %
HCT: 43.9 % (ref 36.0–46.0)
Hemoglobin: 14.9 g/dL (ref 12.0–15.0)
Immature Granulocytes: 0 %
Lymphocytes Relative: 27 %
Lymphs Abs: 1.9 10*3/uL (ref 0.7–4.0)
MCH: 30.2 pg (ref 26.0–34.0)
MCHC: 33.9 g/dL (ref 30.0–36.0)
MCV: 88.9 fL (ref 80.0–100.0)
Monocytes Absolute: 0.7 10*3/uL (ref 0.1–1.0)
Monocytes Relative: 10 %
Neutro Abs: 4.1 10*3/uL (ref 1.7–7.7)
Neutrophils Relative %: 59 %
Platelets: 197 10*3/uL (ref 150–400)
RBC: 4.94 MIL/uL (ref 3.87–5.11)
RDW: 12.9 % (ref 11.5–15.5)
WBC: 6.9 10*3/uL (ref 4.0–10.5)
nRBC: 0 % (ref 0.0–0.2)

## 2023-05-30 MED ORDER — METHOCARBAMOL 500 MG PO TABS: 500.0000 mg | ORAL_TABLET | Freq: Two times a day (BID) | ORAL | 0 refills | Status: DC

## 2023-05-30 NOTE — ED Provider Notes (Signed)
 [facility]    CSN: [id number] Arrival date & time: 05/30/23  1711      History   Chief Complaint Chief Complaint  Patient presents with   Flank Pain    HPI  HPI Susan Green is a 63 y.o. female.   Susan Green presents for right flank pain that started on 05/19/23. She ran a 5K last week and a 2 mile run today at lunch.  Took 800 mg Motrin today around 130 PM which helped her pain.  Pain rated "5-7"/10.   Takes vitamins and that's why her urine is yellow. Says she knows it is not a pulled muscle as she states "I've been running since I was 15", this is not from running.  She eats a lot of protein.  Does not feel like her legs are weak.  No change in pain day versus night. Patient concerned that she may have a kidney stone after speaking with one of the nurse supervisors at the hospital.  She was told to come here for workup for possible kidney stone  Patient notes history of breast cancer and she is afraid that it may have spread.  She completed chemotherapy.  Patient has been under a lot of stress related to caring for her elderly mother.  She states that she has to pay someone to get a day off during the week and even then she does not get to decompress.  Says her brother sometimes is able to help her but her other siblings are not really involved in her 45 year old mother's care.    Past Medical History:  Diagnosis Date   174.9 2011   Right breast T1c,N1 (mic),ER 90%; PR 90%, Her 2 neu not over expressed treated with chemotherapy and reconstruction   Breast cancer (HCC) 01/2009   Right with Chemo   Cancer [facility])    Personal history of chemotherapy 2010   BREAST CA   PONV (postoperative nausea and vomiting)    very naseated   Psoriasis    Thyroid disease    hypothyroidism    Patient Active Problem List   Diagnosis Date Noted   Osteopenia of neck of right femur 02/11/2019   SBO (small bowel obstruction) (HCC) 10/04/2018   Carcinoma of overlapping sites of  right breast in female, estrogen receptor positive (HCC) 08/24/2016   Mass of arm, left 05/09/2016    Past Surgical History:  Procedure Laterality Date   APPENDECTOMY  04/29/13   APPENDECTOMY     AUGMENTATION MAMMAPLASTY Bilateral 08/2009   Reconstruction   BREAST BIOPSY Right 2010   Positive   BREAST RECONSTRUCTION  2011, 2012   3   BREAST RECONSTRUCTION Right 2011   BREAST SURGERY Right 01/25/2009   mastectomy   BREAST SURGERY Left 2011   made to match the right breast   BREAST SURGERY     cecopexy  04-29-13   COLONOSCOPY WITH PROPOFOL N/A 11/08/2021   Procedure: COLONOSCOPY WITH PROPOFOL;  Surgeon: Earline Mayotte, MD;  Location: [facility] ENDOSCOPY;  Service: Endoscopy;  Laterality: N/A;   CRANIOTOMY FOR AVM  1998   DILATION AND CURETTAGE OF UTERUS  2008   polyp   KNEE ARTHROSCOPY Right 2005   KNEE ARTHROSCOPY Left 09/22/2014   Procedure: Left knee arthroscopy, partial medial menisectomy;  Surgeon: Donato Heinz, MD;  Location: [facility] ORS;  Service: Orthopedics;  Laterality: Left;   KNEE ARTHROSCOPY Bilateral    MASTECTOMY Right 2010   BREAST CA   PORTACATH PLACEMENT  2011  TONSILLECTOMY AND ADENOIDECTOMY  1966   TOTAL LAPAROSCOPIC HYSTERECTOMY WITH BILATERAL SALPINGO OOPHORECTOMY      OB History   No obstetric history on file.    Obstetric Comments  1st Menstrual Cycle:  12 1st Pregnancy:  0          Home Medications    Prior to Admission medications   Medication Sig Start Date End Date Taking? Authorizing Provider  celecoxib (CELEBREX) 200 MG capsule Take by mouth. 02/26/23  Yes [provider]  methocarbamol (ROBAXIN) 500 MG tablet Take 1 tablet (500 mg total) by mouth 2 (two) times daily. 05/30/23  Yes Maddix Heinz, DO  Apremilast (OTEZLA) 30 MG TABS Take 1 tablet by mouth daily.     [provider]  b complex vitamins tablet Take 2 tablets by mouth daily.    [provider]  Biotin 5000 MCG TABS Take 1 tablet by mouth.    [provider]  CALCIUM-MAGNESIUM PO Take 1 tablet by mouth.    [provider]  Calcium-Vitamin D 600-200 MG-UNIT per tablet Take 1 tablet by mouth daily.     [provider]  conjugated estrogens (PREMARIN) vaginal cream Place 1 Applicatorful vaginally 2 (two) times a week. 1 gram vaginally at bedtime twice weekly 12/08/18   Kris Pester, MD  Flaxseed, Linseed, (FLAX SEED OIL) 1300 MG CAPS Take 2 capsules by mouth daily with lunch.    [provider]  levothyroxine (SYNTHROID) 88 MCG tablet Take 1 tablet by mouth daily before breakfast.    [provider]  liothyronine (CYTOMEL) 5 MCG tablet Take 5 mcg by mouth. Tuesday and Friday 06/09/19   [provider]  rosuvastatin (CRESTOR) 5 MG tablet Take 0.5 tablets by mouth daily. 05/23/18   [provider]  vitamin E 400 UNIT capsule Take 400 Units by mouth daily.    [provider]    Family History Family History  Problem Relation Age of Onset   Healthy Mother    Healthy Father    Diabetes Father    Heart disease Father    Colon cancer Paternal Grandfather 70   Heart disease Paternal Grandfather    Breast cancer Maternal Aunt 62   Cancer - Other Sister 55       Vulvar   Hypertension Maternal Grandmother     Social History Social History   Tobacco Use   Smoking status: Never   Smokeless tobacco: Never  Vaping Use   Vaping status: Never Used  Substance Use Topics   Alcohol use: Never   Drug use: Never     Allergies   Dilaudid [hydromorphone hcl], Alendronate, Codeine, and Demerol [meperidine]   Review of Systems Review of Systems: egative unless otherwise stated in HPI.      Physical Exam Triage Vital Signs ED Triage Vitals [05/30/23 1726]  Encounter Vitals Group     BP      Systolic BP Percentile      Diastolic BP Percentile      Pulse      Resp      Temp      Temp src      SpO2      Weight      Height      Head Circumference      Peak Flow       Pain Score 5     Pain Loc      Pain Education      Exclude from  Growth Chart    No data found.  Updated Vital Signs BP 125/79 (BP Location: Right Arm)   Pulse 77   Temp 99 F (37.2 C) (Oral)   Resp 18   SpO2 97%   Visual Acuity Right Eye Distance:   Left Eye Distance:   Bilateral Distance:    Right Eye Near:   Left Eye Near:    Bilateral Near:     Physical Exam GEN: well appearing female in no acute distress  CVS: well perfused, regular rate and rhythm RESP: speaking in full sentences without pause, no respiratory distress, clear to auscultation bilaterally MSK:  Lumbar spine: - Inspection: no gross deformity or asymmetry, swelling or ecchymosis. No skin changes  - Palpation: No TTP over the midline or lateral spinous processes, right lower quadratus lumborum hypertonicity with tenderness to deep palpation, no SI joint tenderness bilaterally - ROM: full active ROM of the lumbar spine in flexion and extension  - Strength: 5/5 strength of lower extremity in L4-S1 nerve root distributions b/l - Neuro: sensation intact in the L4-S1 nerve root distribution b/l,  - Special testing: Negative straight leg raise Hip, right: TTP noted at posterior ilium. Passive Log Roll equivalent b/l without restriction. ROM full in all directions; Strength 5/5 in IR/ER/Flex/Ext/Abd/Add. Pelvic alignment unremarkable to inspection and palpation. Non-antalgic gait without trendelenburg / unsteadiness. Greater trochanter without tenderness to palpation. No tenderness over piriformis. SKIN: warm, dry, no overly skin rash or erythema    UC Treatments / Results  Labs (all labs ordered are listed, but only abnormal results are displayed) Labs Reviewed  URINALYSIS, W/ REFLEX TO CULTURE (INFECTION SUSPECTED) - Abnormal; Notable for the following components:      Result Value   Specific Gravity, Urine <1.005 (*)    Bacteria, UA FEW (*)    All other components within normal limits  COMPREHENSIVE  METABOLIC PANEL WITH GFR - Abnormal; Notable for the following components:   Glucose, Bld 100 (*)    BUN 40 (*)    All other components within normal limits  CBC WITH DIFFERENTIAL/PLATELET    EKG   Radiology No results found.   Procedures Procedures (including critical care time)  Medications Ordered in UC Medications - No data to display  Initial Impression / Assessment and Plan / UC Course  I have reviewed the triage vital signs and the nursing notes.  Pertinent labs & imaging results that were available during my care of the patient were reviewed by me and considered in my medical decision making (see chart for details).      Pt is a 63 y.o.  female with 1 to 2 weeks of right flank and hip pain after running a marathon.  Patient is very active and reports at least twice a week since she was 15.  She works as a retired Engineer, civil (consulting) and has been under emotional stress while caring for her elderly mother.  I think there is some element of caregiver fatigue here as well.    Urinalysis obtained and showing no evidence of acute cystitis or hematuria on microscopy.  Reviewed labs with patient.  CBC is normal.  She does have some uremia on CMP but this is also not new.  Reports eating lots of protein throughout the day.  Normal GFR and serum creatinine.   Obtained right hip x-ray to get a better view of the area of concern.  On my review, there are no broken or dislocated bones.  No renal calculi or bony  lesions to suggest metastatic cancer in this area either.  Reassurance provided to the patient.  Expressed concern that this is muscular skeletal in nature.  Patient willing to accept this diagnosis at this time.  Suspect that she has muscular strain.  Treat with Tylenol and NSAIDs as well as a muscle relaxer mostly at bedtime but can be used throughout the day if needed.  Advise relative rest from running for 1 to 2 weeks.  Follow-up with sports medicine, Dr. Augustus Ledger, if pain  continues.  Patient to gradually return to normal activities, as tolerated and continue ordinary activities within the limits permitted by pain.  Motrin, Tylenol and Lidocaine patches PRN for multimodal pain relief. Counseled patient on red flag symptoms and when to seek immediate care. Patient to follow up with orthopedic provider if symptoms do not improve with conservative treatment.  Return and ED precautions given.   Discussed MDM, treatment plan and plan for follow-up with patient who agrees with plan.   Final Clinical Impressions(s) / UC Diagnoses   Final diagnoses:  Right flank pain     Discharge Instructions      There is no blood in your urine or evidence of a urinary tract infection. You don't have blood in your urine or a stone on your xray to suggest a kidney stone. Your CBC was normal.  Your CMP showed an elevated BUN which is not new for you but was otherwise normal.   On my review of your xray images, you did not have any bone lesions, kidney stones, broken  or dislocated bones. The radiologist has not yet read your xray. If it is significantly abnormal or urgent, someone will contact you.  You should see your results in MyChart.   For pain, take Motrin with Tylenol. Consider taking the muscle relaxer at bedtime and refraining from running for the next week.  Follow up with Dr Augustus Ledger or your personal orthopedic provider.         ED Prescriptions     Medication Sig Dispense Auth. Provider   methocarbamol (ROBAXIN) 500 MG tablet Take 1 tablet (500 mg total) by mouth 2 (two) times daily. 20 tablet Braelynne Garinger, DO      PDMP not reviewed this encounter.   Zamire Whitehurst, DO 06/06/23 [phone]

## 2023-05-30 NOTE — ED Triage Notes (Signed)
 Pt presents with right side flank pain x 11 days. Pt denies any urinary symptoms. Pt has ran 2 5Ks and though it may have been due to that but she ran today and had no pain. She is worried about it being a kidney stone.

## 2023-05-30 NOTE — Discharge Instructions (Addendum)
 There is no blood in your urine or evidence of a urinary tract infection. You don't have blood in your urine or a stone on your xray to suggest a kidney stone. Your CBC was normal.  Your CMP showed an elevated BUN which is not new for you but was otherwise normal.   On my review of your xray images, you did not have any bone lesions, kidney stones, broken  or dislocated bones. The radiologist has not yet read your xray. If it is significantly abnormal or urgent, someone will contact you.  You should see your results in MyChart.   For pain, take Motrin with Tylenol. Consider taking the muscle relaxer at bedtime and refraining from running for the next week.  Follow up with Dr Ashley Royalty or your personal orthopedic provider.

## 2024-01-03 ENCOUNTER — Encounter: Payer: Self-pay | Admitting: Internal Medicine

## 2024-01-03 ENCOUNTER — Inpatient Hospital Stay: Payer: 59 | Admitting: Internal Medicine

## 2024-01-03 ENCOUNTER — Inpatient Hospital Stay: Payer: 59 | Attending: Internal Medicine

## 2024-01-03 VITALS — BP 139/85 | HR 62 | Temp 97.4°F | Resp 20 | Ht 63.0 in | Wt 135.0 lb

## 2024-01-03 DIAGNOSIS — C50811 Malignant neoplasm of overlapping sites of right female breast: Secondary | ICD-10-CM

## 2024-01-03 DIAGNOSIS — Z800 Family history of malignant neoplasm of digestive organs: Secondary | ICD-10-CM | POA: Insufficient documentation

## 2024-01-03 DIAGNOSIS — Z808 Family history of malignant neoplasm of other organs or systems: Secondary | ICD-10-CM | POA: Insufficient documentation

## 2024-01-03 DIAGNOSIS — Z853 Personal history of malignant neoplasm of breast: Secondary | ICD-10-CM

## 2024-01-03 DIAGNOSIS — Z803 Family history of malignant neoplasm of breast: Secondary | ICD-10-CM | POA: Insufficient documentation

## 2024-01-03 DIAGNOSIS — Z170 Estrogen receptor positive status [ER+]: Secondary | ICD-10-CM | POA: Diagnosis not present

## 2024-01-03 DIAGNOSIS — M8580 Other specified disorders of bone density and structure, unspecified site: Secondary | ICD-10-CM | POA: Insufficient documentation

## 2024-01-03 DIAGNOSIS — Z08 Encounter for follow-up examination after completed treatment for malignant neoplasm: Secondary | ICD-10-CM | POA: Insufficient documentation

## 2024-01-03 DIAGNOSIS — D751 Secondary polycythemia: Secondary | ICD-10-CM | POA: Insufficient documentation

## 2024-01-03 LAB — CBC WITH DIFFERENTIAL (CANCER CENTER ONLY)
Abs Immature Granulocytes: 0.01 K/uL (ref 0.00–0.07)
Basophils Absolute: 0.1 K/uL (ref 0.0–0.1)
Basophils Relative: 1 %
Eosinophils Absolute: 0.4 K/uL (ref 0.0–0.5)
Eosinophils Relative: 7 %
HCT: 45.9 % (ref 36.0–46.0)
Hemoglobin: 15.2 g/dL — ABNORMAL HIGH (ref 12.0–15.0)
Immature Granulocytes: 0 %
Lymphocytes Relative: 23 %
Lymphs Abs: 1.3 K/uL (ref 0.7–4.0)
MCH: 30 pg (ref 26.0–34.0)
MCHC: 33.1 g/dL (ref 30.0–36.0)
MCV: 90.5 fL (ref 80.0–100.0)
Monocytes Absolute: 0.6 K/uL (ref 0.1–1.0)
Monocytes Relative: 10 %
Neutro Abs: 3.3 K/uL (ref 1.7–7.7)
Neutrophils Relative %: 59 %
Platelet Count: 211 K/uL (ref 150–400)
RBC: 5.07 MIL/uL (ref 3.87–5.11)
RDW: 12.9 % (ref 11.5–15.5)
WBC Count: 5.7 K/uL (ref 4.0–10.5)
nRBC: 0 % (ref 0.0–0.2)

## 2024-01-03 LAB — CMP (CANCER CENTER ONLY)
ALT: 19 U/L (ref 0–44)
AST: 28 U/L (ref 15–41)
Albumin: 4.1 g/dL (ref 3.5–5.0)
Alkaline Phosphatase: 47 U/L (ref 38–126)
Anion gap: 9 (ref 5–15)
BUN: 21 mg/dL (ref 8–23)
CO2: 26 mmol/L (ref 22–32)
Calcium: 9 mg/dL (ref 8.9–10.3)
Chloride: 99 mmol/L (ref 98–111)
Creatinine: 0.83 mg/dL (ref 0.44–1.00)
GFR, Estimated: >60 mL/min (ref >60)
Glucose, Bld: 85 mg/dL (ref 70–99)
Potassium: 3.6 mmol/L (ref 3.5–5.1)
Sodium: 134 mmol/L — ABNORMAL LOW (ref 135–145)
Total Bilirubin: 0.7 mg/dL (ref 0.0–1.2)
Total Protein: 7.5 g/dL (ref 6.5–8.1)

## 2024-01-03 LAB — VITAMIN D 25 HYDROXY (VIT D DEFICIENCY, FRACTURES): Vit D, 25-Hydroxy: 83.23 ng/mL (ref 30–100)

## 2024-01-03 NOTE — Progress Notes (Signed)
 No concerns today. Would like breast exam.

## 2024-01-03 NOTE — Assessment & Plan Note (Signed)
 SABRA

## 2024-01-03 NOTE — Progress Notes (Signed)
 [facility] OFFICE PROGRESS NOTE  Patient Care Team: Diedra Lame, MD as PCP - General (Family Medicine) Dessa, Reyes ORN, MD as Consulting Physician (General Surgery) Diedra Lame, MD (Family Medicine) Rennie Cindy SAUNDERS, MD as Consulting Physician (Hematology and Oncology)   Cancer Staging  No matching staging information was found for the patient.    Oncology History Overview Note  carcinoma of breast, diagnosis.  In February of 2011.  Right breast.  ER positive.  PR positive, HER-2/neu, and not overexpressed s/p Right mastec  Status post 4 cycles of chemotherapy with Cytoxan, and Taxotere Finished chemotherapy on 07/07/2009  Status post bilateral   oopherectomy  and  hysterectomy in February of 2012   Tamoxifen June of 2011   # Genetcis- delcines  # Osteopenia- hair loss sec to Foaomax   Carcinoma of overlapping 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, estrogen receptor positive (HCC)  08/24/2016 Initial Diagnosis   Carcinoma of overlapping 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, estrogen receptor positive (HCC)      INTERVAL HISTORY: Alone.  Ambulating independently.  Susan Green 63 y.o.  female pleasant patient above history of 2011-right breast cancer  ER/PR positive; Her2 negative is here for follow-up.    However she denies any new lumps or bumps.  Appetite is good.  No weight loss.  She continues to be physically active.  Review of Systems  Constitutional:  Negative for chills, diaphoresis, fever, malaise/fatigue and weight loss.  HENT:  Negative for nosebleeds and sore throat.   Eyes:  Negative for double vision.  Respiratory:  Negative for cough, hemoptysis, sputum production, shortness of breath and wheezing.   Cardiovascular:  Negative for chest pain, palpitations, orthopnea and leg swelling.  Gastrointestinal:  Negative for abdominal pain, blood in stool, constipation, diarrhea, heartburn, melena, nausea and vomiting.  Genitourinary:  Negative for  dysuria, frequency and urgency.  Musculoskeletal:  Negative for back pain and joint pain.  Skin: Negative.  Negative for itching and rash.  Neurological:  Negative for dizziness, tingling, focal weakness, weakness and headaches.  Endo/Heme/Allergies:  Does not bruise/bleed easily.  Psychiatric/Behavioral:  Negative for depression. The patient is not nervous/anxious and does not have insomnia.       PAST MEDICAL HISTORY :  Past Medical History:  Diagnosis Date   174.9 2011   Right breast T1c,N1 (mic),ER 90%; PR 90%, Her 2 neu not over expressed treated with chemotherapy and reconstruction   Breast cancer (HCC) 01/2009   Right with Chemo   Cancer (HCC)    Personal history of chemotherapy 2010   BREAST CA   PONV (postoperative nausea and vomiting)    very naseated   Psoriasis    Thyroid  disease    hypothyroidism    PAST SURGICAL HISTORY :   Past Surgical History:  Procedure Laterality Date   APPENDECTOMY  04/29/13   APPENDECTOMY     AUGMENTATION MAMMAPLASTY Bilateral 08/2009   Reconstruction   BREAST BIOPSY Right 2010   Positive   BREAST RECONSTRUCTION  2011, 2012   3   BREAST RECONSTRUCTION Right 2011   BREAST SURGERY Right 01/25/2009   mastectomy   BREAST SURGERY Left 2011   made to match the right breast   BREAST SURGERY     cecopexy  04-29-13   COLONOSCOPY WITH PROPOFOL  N/A 11/08/2021   Procedure: COLONOSCOPY WITH PROPOFOL ;  Surgeon: Dessa Reyes ORN, MD;  Location: [facility] ENDOSCOPY;  Service: Endoscopy;  Laterality: N/A;   CRANIOTOMY FOR AVM  1998  DILATION AND CURETTAGE OF UTERUS  2008   polyp   KNEE ARTHROSCOPY Right 2005   KNEE ARTHROSCOPY Left 09/22/2014   Procedure: Left knee arthroscopy, partial medial menisectomy;  Surgeon: Lynwood SHAUNNA Hue, MD;  Location: [facility] ORS;  Service: Orthopedics;  Laterality: Left;   KNEE ARTHROSCOPY Bilateral    MASTECTOMY Right 2010   BREAST CA   PORTACATH PLACEMENT  2011   TONSILLECTOMY AND ADENOIDECTOMY  1966   TOTAL LAPAROSCOPIC  HYSTERECTOMY WITH BILATERAL SALPINGO OOPHORECTOMY      FAMILY HISTORY :   Family History  Problem Relation Age of Onset   Healthy Mother    Healthy Father    Diabetes Father    Heart disease Father    Colon cancer Paternal Grandfather 20   Heart disease Paternal Grandfather    Breast cancer Maternal Aunt 38   Cancer - Other Sister 30       Vulvar   Hypertension Maternal Grandmother     SOCIAL HISTORY:   Social History   Tobacco Use   Smoking status: Never   Smokeless tobacco: Never  Vaping Use   Vaping status: Never Used  Substance Use Topics   Alcohol use: Never   Drug use: Never    ALLERGIES:  is allergic to dilaudid  [hydromorphone  hcl], alendronate, codeine, and demerol [meperidine].  MEDICATIONS:  Current Outpatient Medications  Medication Sig Dispense Refill   Apremilast (OTEZLA) 30 MG TABS Take 1 tablet by mouth daily.      b complex vitamins tablet Take 2 tablets by mouth daily.     Biotin 5000 MCG TABS Take 1 tablet by mouth.     CALCIUM-MAGNESIUM PO Take 1 tablet by mouth.     Calcium-Vitamin D  600-200 MG-UNIT per tablet Take 1 tablet by mouth daily.      conjugated estrogens  (PREMARIN ) vaginal cream Place 1 Applicatorful vaginally 2 (two) times a week. 1 gram vaginally at bedtime twice weekly 30 g 3   Flaxseed, Linseed, (FLAX SEED OIL) 1300 MG CAPS Take 2 capsules by mouth daily with lunch.     levothyroxine  (SYNTHROID ) 88 MCG tablet Take 1 tablet by mouth daily before breakfast.     liothyronine (CYTOMEL) 5 MCG tablet Take 5 mcg by mouth. Tuesday and Friday     rosuvastatin (CRESTOR) 5 MG tablet Take 0.5 tablets by mouth daily.     vitamin E 400 UNIT capsule Take 400 Units by mouth daily.     No current facility-administered medications for this visit.    PHYSICAL EXAMINATION: ECOG PERFORMANCE STATUS: 0 - Asymptomatic  Right and left BREAST exam [in the presence of nurse]-right mastectomy with implant noted.  Left breast -implant noted; no unusual skin  changes or dominant masses felt. Surgical scars noted.    BP 139/85 (BP Location: Left Arm, Patient Position: Sitting, Cuff Size: Normal)   Pulse 62   Temp (!) 97.4 F (36.3 C) (Tympanic)   Resp 20   Ht 5' 3 (1.6 m)   Wt 135 lb (61.2 kg)   SpO2 98%   BMI 23.91 kg/m   Filed Weights   01/03/24 1254  Weight: 135 lb (61.2 kg)    Physical Exam HENT:     Head: Normocephalic and atraumatic.     Mouth/Throat:     Pharynx: No oropharyngeal exudate.  Eyes:     Pupils: Pupils are equal, round, and reactive to light.  Cardiovascular:     Rate and Rhythm: Normal rate and regular rhythm.  Pulmonary:  Effort: No respiratory distress.     Breath sounds: No wheezing.  Abdominal:     General: Bowel sounds are normal. There is no distension.     Palpations: Abdomen is soft. There is no mass.     Tenderness: There is no abdominal tenderness. There is no guarding or rebound.  Musculoskeletal:        General: No tenderness. Normal range of motion.     Cervical back: Normal range of motion and neck supple.  Skin:    General: Skin is warm.     Comments: Breast exam  [in presence of nurse] right breast mastectomy ; implant-no lumps or bumps.  Left breast no unusual skin changes or dominant masses felt-implant in place   Neurological:     Mental Status: She is alert and oriented to person, place, and time.  Psychiatric:        Mood and Affect: Affect normal.    LABORATORY DATA:  I have reviewed the data as listed    Component Value Date/Time   NA 134 (L) 01/03/2024 1244   NA 140 01/27/2014 1538   K 3.6 01/03/2024 1244   K 3.7 01/27/2014 1538   CL 99 01/03/2024 1244   CL 103 01/27/2014 1538   CO2 26 01/03/2024 1244   CO2 34 (H) 01/27/2014 1538   GLUCOSE 85 01/03/2024 1244   GLUCOSE 140 (H) 01/27/2014 1538   BUN 21 01/03/2024 1244   BUN 18 01/27/2014 1538   CREATININE 0.83 01/03/2024 1244   CREATININE 1.02 01/27/2014 1538   CALCIUM 9.0 01/03/2024 1244   CALCIUM 8.4 (L)  01/27/2014 1538   PROT 7.5 01/03/2024 1244   PROT 6.7 01/27/2014 1538   ALBUMIN 4.1 01/03/2024 1244   ALBUMIN 3.4 01/27/2014 1538   AST 28 01/03/2024 1244   ALT 19 01/03/2024 1244   ALT 22 01/27/2014 1538   ALKPHOS 47 01/03/2024 1244   ALKPHOS 49 01/27/2014 1538   BILITOT 0.7 01/03/2024 1244   GFRNONAA >60 01/03/2024 1244   GFRNONAA >60 01/27/2014 1538   GFRNONAA >60 05/22/2013 1212   GFRAA >60 09/22/2019 0921   GFRAA >60 01/27/2014 1538   GFRAA >60 05/22/2013 1212    No results found for: SPEP, UPEP  Lab Results  Component Value Date   WBC 5.7 01/03/2024   NEUTROABS 3.3 01/03/2024   HGB 15.2 (H) 01/03/2024   HCT 45.9 01/03/2024   MCV 90.5 01/03/2024   PLT 211 01/03/2024      Chemistry      Component Value Date/Time   NA 134 (L) 01/03/2024 1244   NA 140 01/27/2014 1538   K 3.6 01/03/2024 1244   K 3.7 01/27/2014 1538   CL 99 01/03/2024 1244   CL 103 01/27/2014 1538   CO2 26 01/03/2024 1244   CO2 34 (H) 01/27/2014 1538   BUN 21 01/03/2024 1244   BUN 18 01/27/2014 1538   CREATININE 0.83 01/03/2024 1244   CREATININE 1.02 01/27/2014 1538      Component Value Date/Time   CALCIUM 9.0 01/03/2024 1244   CALCIUM 8.4 (L) 01/27/2014 1538   ALKPHOS 47 01/03/2024 1244   ALKPHOS 49 01/27/2014 1538   AST 28 01/03/2024 1244   ALT 19 01/03/2024 1244   ALT 22 01/27/2014 1538   BILITOT 0.7 01/03/2024 1244       RADIOGRAPHIC STUDIES: I have personally reviewed the radiological images as listed and agreed with the findings in the report. No results found.   ASSESSMENT & PLAN:  Carcinoma of overlapping 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, estrogen receptor positive (HCC)    History of breast cancer in female # STAGE I -right breast ER/PR positive, HER-2 not overexpressed. finished tamoxifen sep 2017; under active surveillance.  Stable.  Clinically no evidence of recurrence.  Status post right mastectomy; Left mastopexy/implant-recommend screening mammogram of the left  side. OCT 2025- LEFT UNILATERAL- mammogram- WNL.   # OCT 2023- Dexa Scan: T-score of -2.9.DECLINED Reclast- will repeat BMD-   #  Secondary Erythrocytosis: JAK-2 NEGATIVE; suspect secondary.  Monitor for now.  Staff will call re: mychart-  # DISPOSITION: # Left screening mammogram/ BMD in nov 2025.  # follow up in 12 months; MD-labs- cbc/cmp ca-27-29; vit D 25- [place] levels;.-Dr.B   No orders of the defined types were placed in this encounter.  All questions were answered. The patient knows to call the clinic with any problems, questions or concerns.      Cindy JONELLE Joe, MD 01/05/2024 6:14 PM

## 2024-01-03 NOTE — Assessment & Plan Note (Addendum)
#   STAGE I -right breast ER/PR positive, HER-2 not overexpressed. finished tamoxifen sep 2017; under active surveillance.  Stable.  Clinically no evidence of recurrence.  Status post right mastectomy; Left mastopexy/implant-recommend screening mammogram of the left side. OCT 2025- LEFT UNILATERAL- mammogram- WNL.   # OCT 2023- Dexa Scan: T-score of -2.9.DECLINED Reclast- will repeat BMD-   #  Secondary Erythrocytosis: JAK-2 NEGATIVE; suspect secondary.  Monitor for now.  Staff will call re: mychart-  # DISPOSITION: # Left screening mammogram/ BMD in nov 2025.  # follow up in 12 months; MD-labs- cbc/cmp ca-27-29; vit D 25- [place] levels;.-Dr.B

## 2024-01-04 LAB — CANCER ANTIGEN 27.29: CA 27.29: 31.4 U/mL (ref 0.0–38.6)

## 2024-01-05 ENCOUNTER — Encounter: Payer: Self-pay | Admitting: Internal Medicine

## 2024-01-05 ENCOUNTER — Ambulatory Visit: Payer: Self-pay | Admitting: Internal Medicine

## 2024-01-05 NOTE — Progress Notes (Signed)
 AJ-please inform patient that her cancer markers are normal.  Keep appointments as planned- Thank you- GB

## 2024-01-06 NOTE — Addendum Note (Signed)
 Addended by: JOSHUA ALFONSO CROME on: 01/06/2024 08:22 AM   Modules accepted: Orders

## 2024-01-06 NOTE — Progress Notes (Signed)
 LMOVM to call back

## 2024-01-07 NOTE — Progress Notes (Signed)
 Pt notified. # given to [place] to make appts for bmd and mammo.

## 2024-01-14 ENCOUNTER — Ambulatory Visit
Admission: RE | Admit: 2024-01-14 | Discharge: 2024-01-14 | Disposition: A | Discharge status: Home / Self Care | Source: Ambulatory Visit | Attending: Internal Medicine | Admitting: Internal Medicine

## 2024-01-14 DIAGNOSIS — Z170 Estrogen receptor positive status [ER+]: Secondary | ICD-10-CM | POA: Diagnosis present

## 2024-01-14 DIAGNOSIS — C50811 Malignant neoplasm of overlapping sites of right female breast: Secondary | ICD-10-CM | POA: Diagnosis present

## 2024-01-21 ENCOUNTER — Ambulatory Visit: Payer: Self-pay | Admitting: Internal Medicine

## 2024-12-29 ENCOUNTER — Inpatient Hospital Stay

## 2024-12-29 ENCOUNTER — Inpatient Hospital Stay: Admitting: Internal Medicine

## 2025-01-01 ENCOUNTER — Inpatient Hospital Stay: Admitting: Internal Medicine

## 2025-01-01 ENCOUNTER — Inpatient Hospital Stay
